# Patient Record
Sex: Male | Born: 1970 | Race: White | Hispanic: No | Marital: Married | Smoking: Never smoker
Health system: Southern US, Community
[De-identification: ages and names within clinical notes are randomized; demographics above are authoritative.]

## PROBLEM LIST (undated history)

## (undated) DIAGNOSIS — I10 Essential (primary) hypertension: Secondary | ICD-10-CM

## (undated) DIAGNOSIS — K219 Gastro-esophageal reflux disease without esophagitis: Secondary | ICD-10-CM

## (undated) DIAGNOSIS — L570 Actinic keratosis: Secondary | ICD-10-CM

## (undated) HISTORY — DX: Actinic keratosis: L57.0

---

## 2006-11-08 ENCOUNTER — Emergency Department: Payer: Self-pay | Admitting: Emergency Medicine

## 2007-05-28 ENCOUNTER — Ambulatory Visit: Payer: Self-pay | Admitting: Family Medicine

## 2008-07-13 IMAGING — CT CT ABD-PELV W/O CM
1 of 2 series · 15 of 32 positions shown, 19 images · non-contrast
Comparison: none

REASON FOR EXAM: (1) L flank pain; (2) L flank pain
COMMENTS:

[Series 2: stone · axial · 0.77mm/px · z∈[-1100,-674]mm · 15 of 160 slices shown, 19 images]
[im 12/160  soft-tissue]
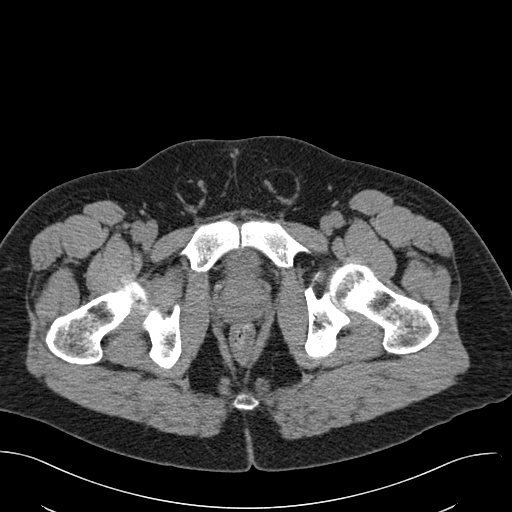
[im 12/160  bone]
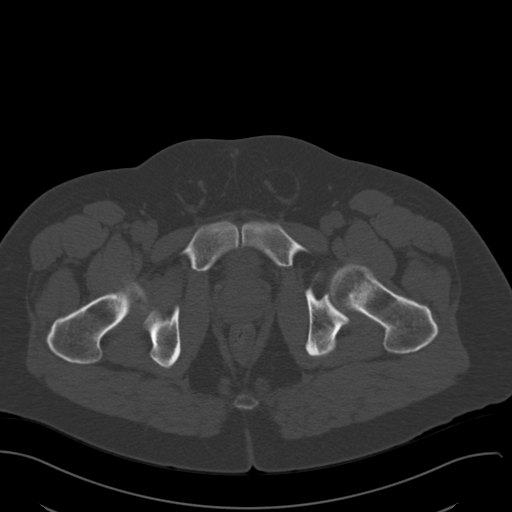
[im 23/160  soft-tissue]
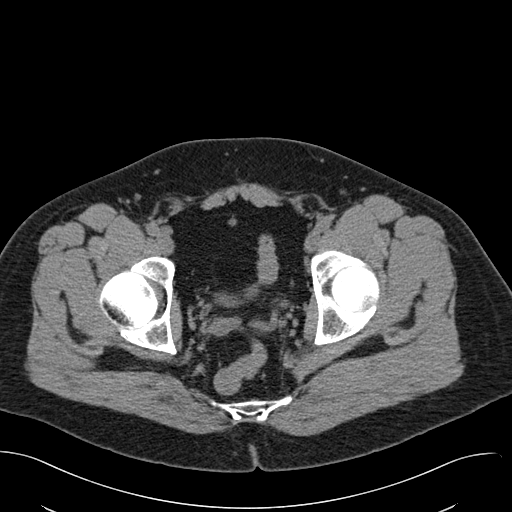
[im 35/160  soft-tissue]
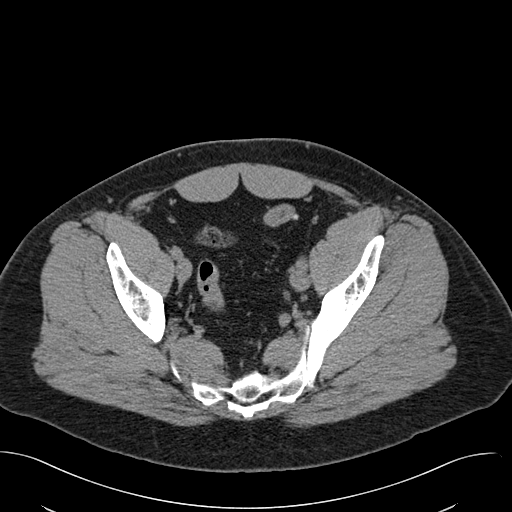
[im 46/160  soft-tissue]
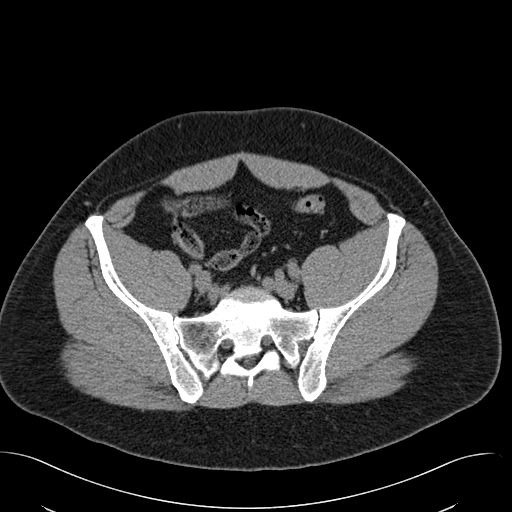
[im 57/160  soft-tissue]
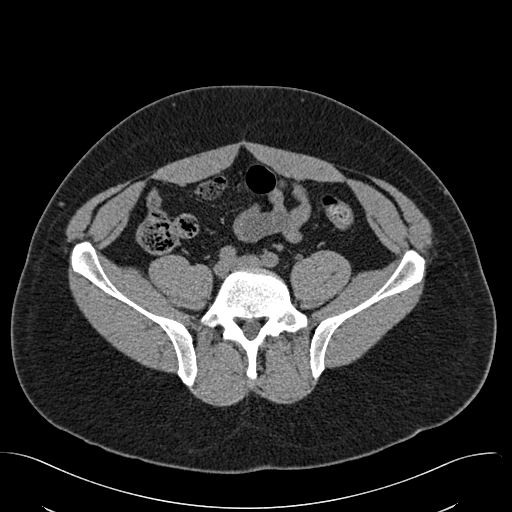
[im 69/160  soft-tissue]
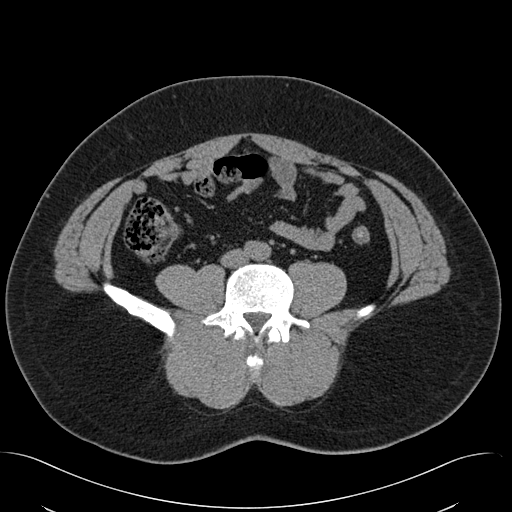
[im 80/160  soft-tissue]
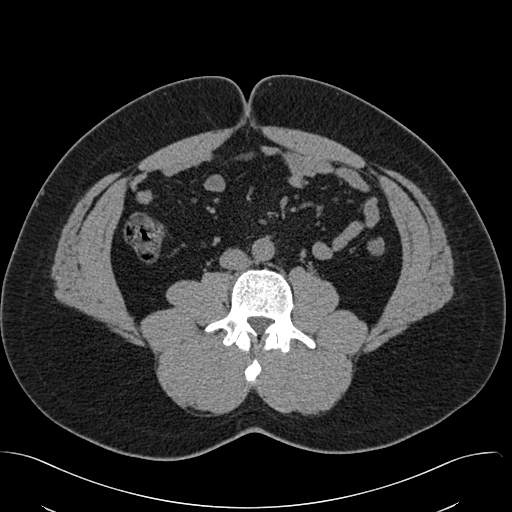
[im 91/160  soft-tissue]
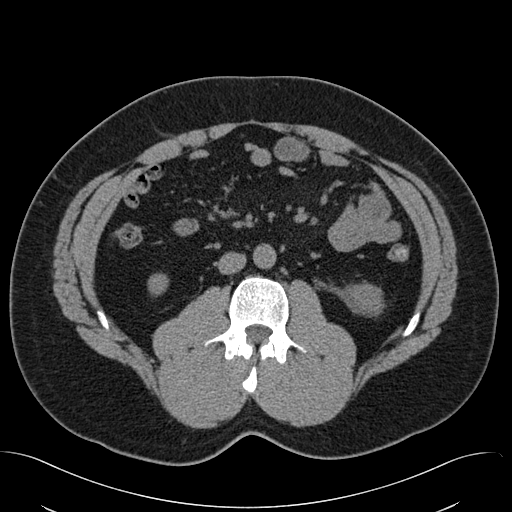
[im 103/160  soft-tissue]
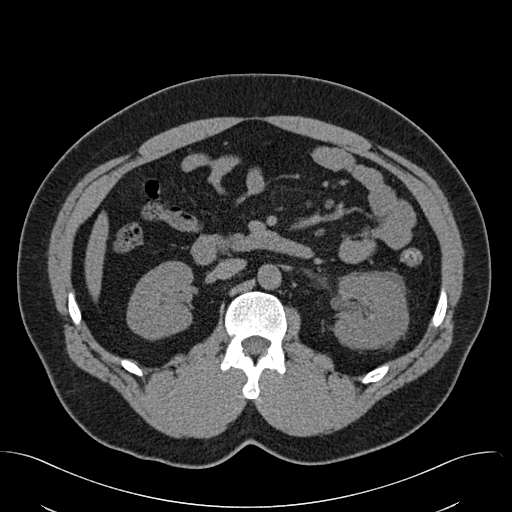
[im 103/160  bone]
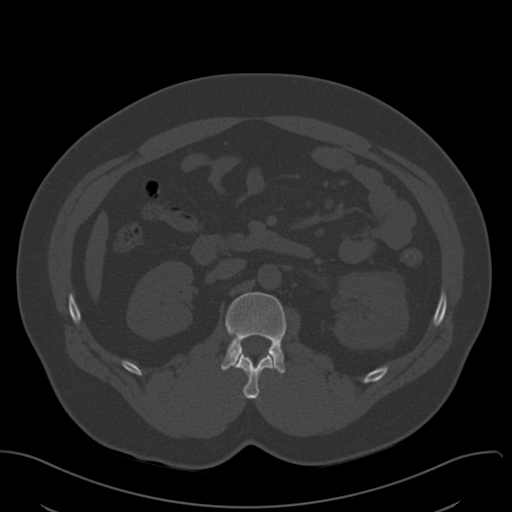
[im 114/160  soft-tissue]
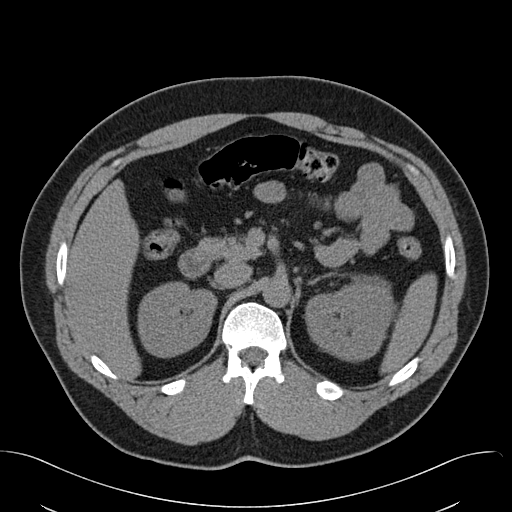
[im 125/160  soft-tissue]
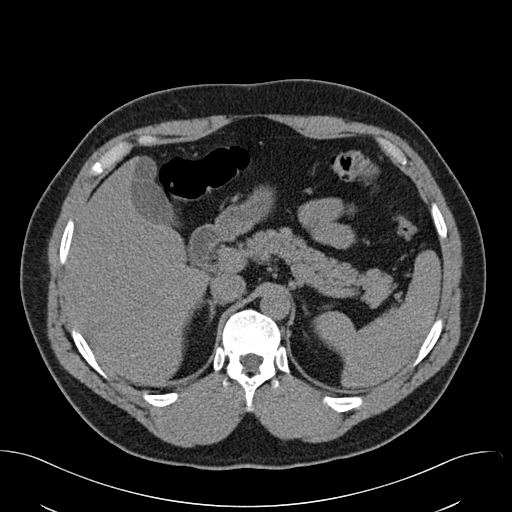
[im 137/160  soft-tissue]
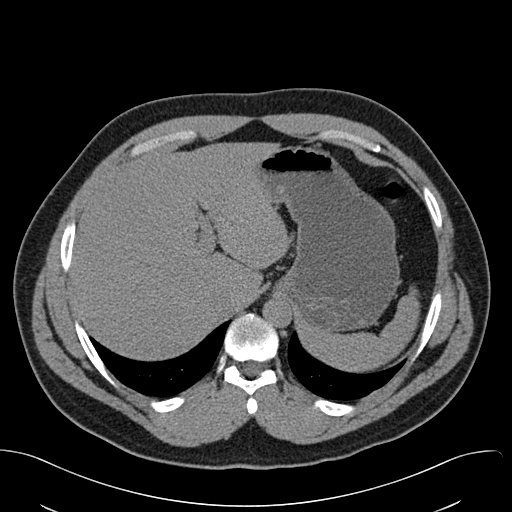
[im 137/160  lung]
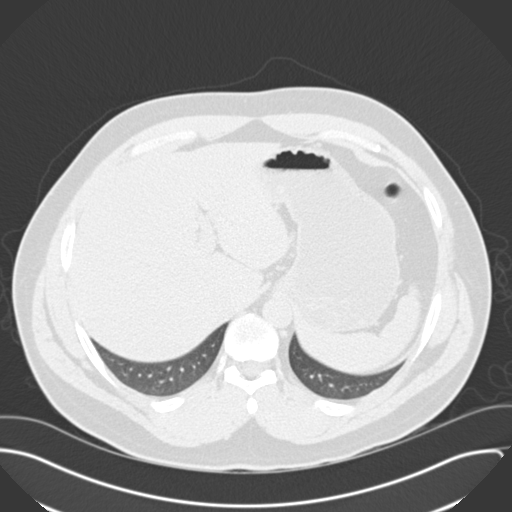
[im 142/160  lung]
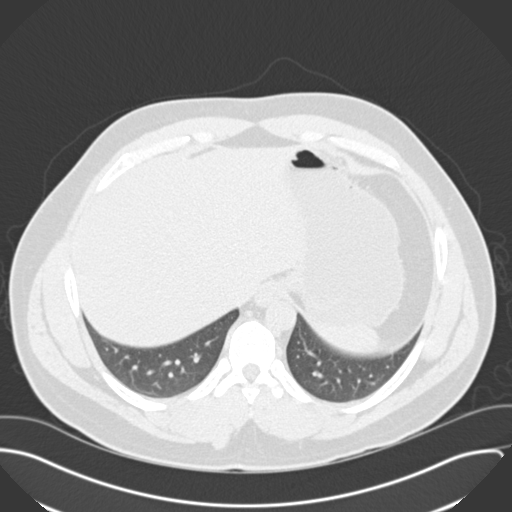
[im 148/160  soft-tissue]
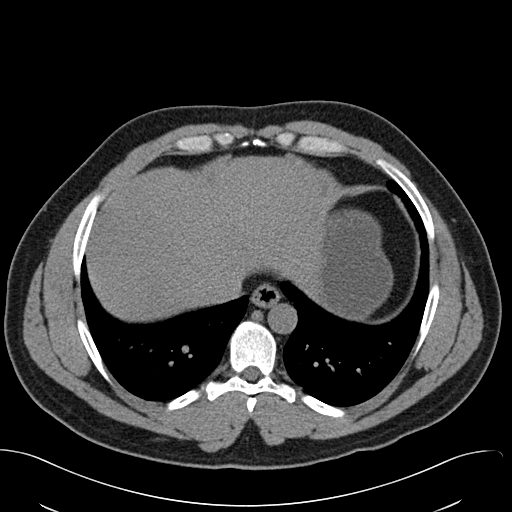
[im 148/160  lung]
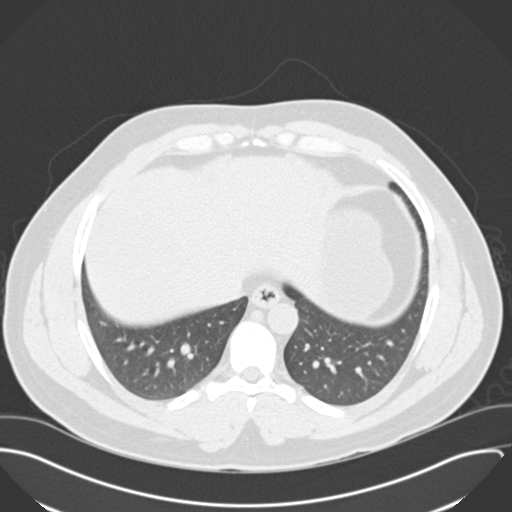
[im 154/160  lung]
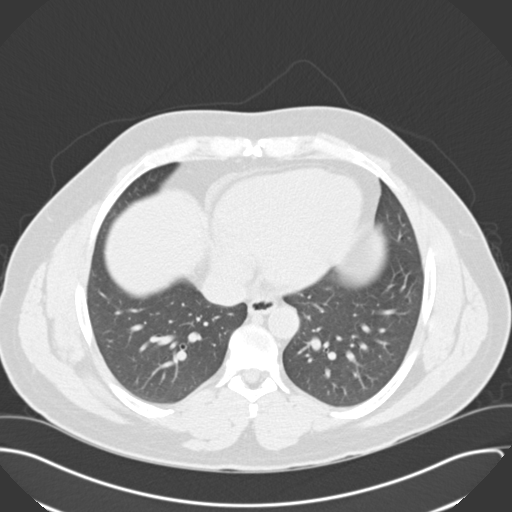

[15 of 32 positions shown; findings below may reference images not displayed]

PROCEDURE:     CT  - CT ABDOMEN AND PELVIS W[DATE]  [DATE]

RESULT:     Emergent CT of the abdomen and pelvis is performed. The patient
has no prior exam for comparison. The lung bases are clear. There is
perinephric stranding and a trace amount of perinephric fluid present on the
LEFT. The fluid thickness is no more than a 3 to 4 mm in thickness. There is
some stranding or fluid surrounding the proximal portion of the LEFT ureter.
The collecting system and ureter on the LEFT are slightly more prominent
than that on the RIGHT. The bladder is nondistended. No definite radiopaque
stones are seen. The possibility of a recently passed stone would be most
likely. A nonvisible obstructing mass could also be considered. Clinical and
laboratory correlation and followup is recommended. There is no free air.
The appendix appears to be normal and contains some air. There is no
evidence of diverticulitis. No bowel wall thickening is seen. The pancreas,
gallbladder, liver, spleen and adrenal glands as well as the RIGHT kidney
appear to be grossly normal for a noncontrast study. The aorta is normal in
caliber. There is no evidence of hemorrhage.
IMPRESSION: Slight prominence the LEFT ureter and LEFT renal collecting system without
significant hydronephrosis. There is a small amount of perinephric fluid
present on the LEFT. The appearance suggests a possible recently passed LEFT
ureteral stone. If the patient's symptoms have not resolved, then further
investigation and followup is suggested.

## 2009-08-12 ENCOUNTER — Ambulatory Visit: Payer: Self-pay | Admitting: Internal Medicine

## 2013-04-28 ENCOUNTER — Ambulatory Visit: Payer: Self-pay | Admitting: Internal Medicine

## 2013-05-25 ENCOUNTER — Ambulatory Visit: Payer: Self-pay | Admitting: Otolaryngology

## 2013-05-26 LAB — PATHOLOGY REPORT

## 2014-01-19 DIAGNOSIS — I10 Essential (primary) hypertension: Secondary | ICD-10-CM | POA: Insufficient documentation

## 2014-01-19 DIAGNOSIS — E890 Postprocedural hypothyroidism: Secondary | ICD-10-CM | POA: Insufficient documentation

## 2014-11-10 NOTE — Op Note (Signed)
PATIENT NAME:  Timothy Mccullough, Timothy Mccullough MR#:  [id number] DATE OF BIRTH:  1971/03/08  DATE OF PROCEDURE:  05/25/2013  PREOPERATIVE DIAGNOSIS: Left thyroid nodule.   POSTOPERATIVE DIAGNOSES: Left thyroid nodule.   PROCEDURE: Left hemithyroidectomy.   SURGEON: Malon Kindle, MD.   ASSISTANT: Margaretha Sheffield, MD   ANESTHESIA: General endotracheal.   INDICATIONS: This is a 44 year old male with a large left thyroid nodule with FNA suggesting it could be a colloid nodule. The nodule was quite large; however, 7 cm in size thus far.   FINDINGS: This was a 7 cm nodule, was well defined capsule emanating from the lower pole of the left thyroid gland.   COMPLICATIONS: None.   DESCRIPTION OF PROCEDURE: After obtaining informed consent, the patient was taken to the Operating Room and placed in the supine position. After induction of general endotracheal anesthesia with use of the laryngeal nerve monitor, the neck was injected in the lower neck with 1% lidocaine with epinephrine 1:200,000. The laryngeal nerve monitor was directly visualized during placement to make sure the probes were between the vocal cords. The patient was then prepped and draped in the usual sterile fashion after being placed on a shoulder roll.   A 15 blade was used to incise the skin. The skin incision was carried down through the platysma with the Harmonic scalpel. The strap muscles were identified and divided in the midline and retracted laterally. Large left thyroid lobe mass was immediately identified. The strap muscles were dissected off of the mass and it was carefully dissected out with both blunt dissection, as well as dividing soft tissue attachments with the Harmonic scalpel.  Feeding vessels were identified and divided with the Harmonic scalpel as they were encountered. The nodule itself was well defined and very well encapsulated and we ended up removing the nodule separate from the small remaining superior portion of the thyroid lobe.  Late in the dissection the recurrent laryngeal nerve was identified and was well free from the nodule, which was removed separately. The remainder of the gland was then carefully dissected out identifying the recurrent laryngeal nerve and dividing the superior and inferior pole vessels with the Harmonic scalpel. Two separate structures consistent with inferior and superior parathyroid tissue were identified and preserved within associated fatty tissue in the neck adjacent to the thyroid gland itself. The thyroid gland was dissected off of the trachea at [facility] ligament with the recurrent laryngeal nerve in full view utilizing the Harmonic scalpel. It was then sent along with the nodule as one specimen.   The wound is irrigated and inspected for bleeding. Oozing was controlled with the bipolar cautery and there was a bleeding area inferiorly just below the clavicles that appeared to be small vein. This was clamped and suture ligated. The wound was then closed in layers. The strap muscles were reapproximated with 3-0 Vicryl suture. The platysma muscle was reapproximated with 3-0 Vicryl followed by subcutaneous closure. The skin was closed using a 3-0 Prolene suture in a running subcutaneous stitch.   Prior to closure a #7 JP drain was placed through a separate stab incision in the skin. This was secured to the skin with 3-0 Prolene suture. The patient was then returned to the anesthesiologist for awakening. He was awakened and taken to the recovery room in good condition postoperatively. It should be noted that after identification of the recurrent laryngeal nerve with the nerve stimulator, the nerve was again stimulated after removal of all the thyroid tissue to ensure its integrity and  it did stimulate. Blood loss was less than 200 mL.   _________________ Sammuel Hines. Richardson Landry, MD psb:sg D: 05/25/2013 10:34:01 ET T: 05/25/2013 10:55:14 ET JOB#: [id number]  cc: Sammuel Hines. Richardson Landry, MD, <Dictator> Riley Nearing  MD ELECTRONICALLY SIGNED 05/31/2013 8:35

## 2015-01-01 IMAGING — US THYROID ULTRASOUND
1 series · 14 of 25 positions shown · non-contrast
Comparison: none

REASON FOR EXAM: thyroidmegaly
COMMENTS:

[Series 1: thyroid ultrasound · 0.10mm/px · 14 of 64 slices shown]
[im 1/64]
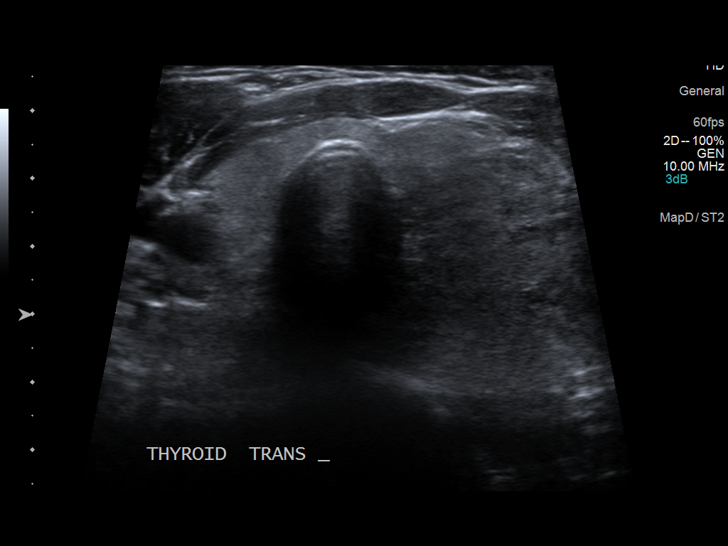
[im 6/64]
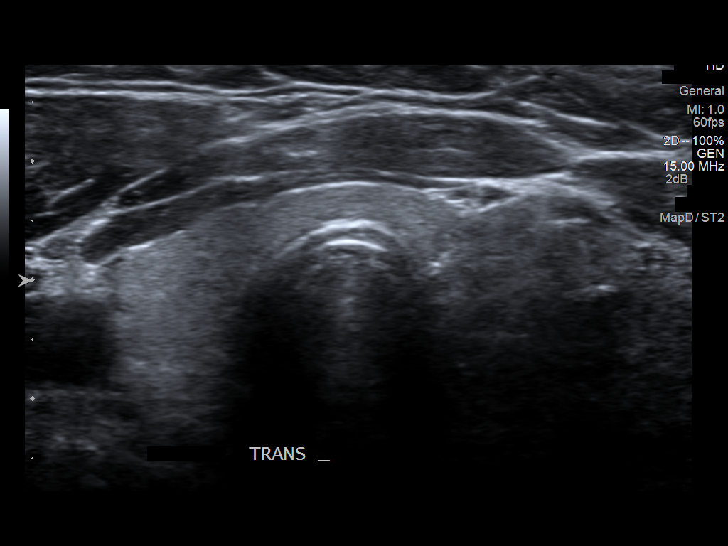
[im 11/64]
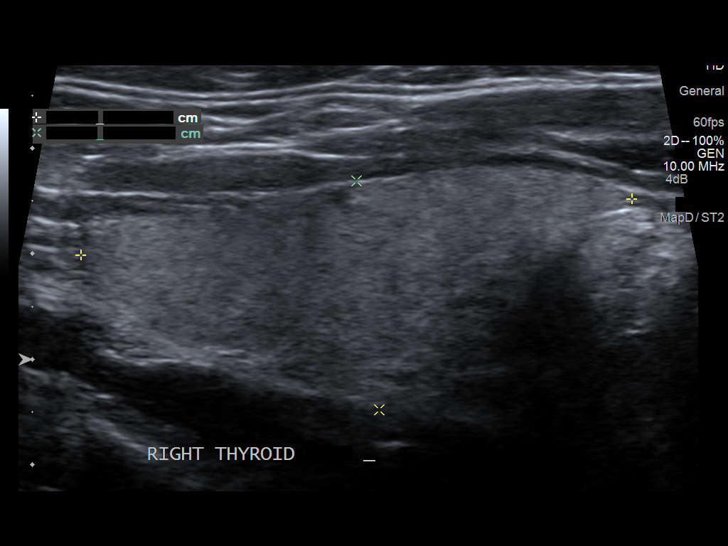
[im 16/64]
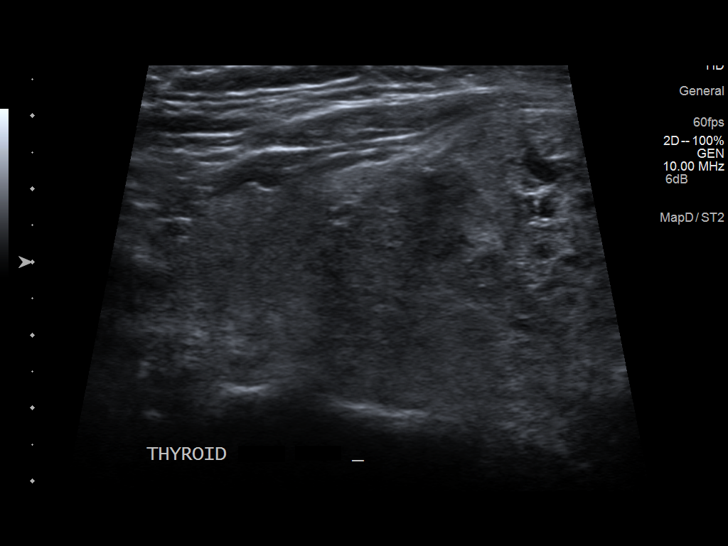
[im 22/64]
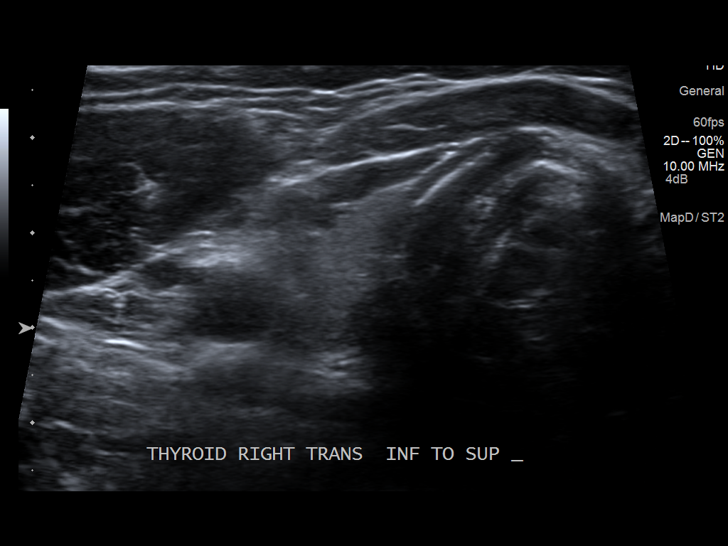
[im 24/64]
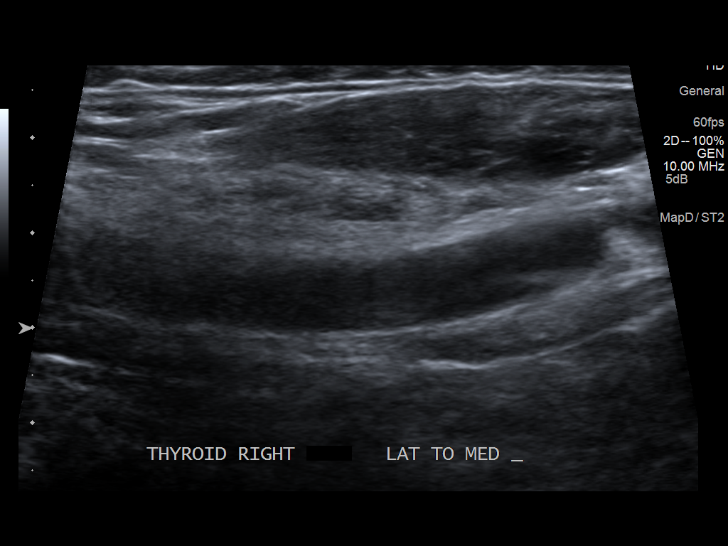
[im 29/64]
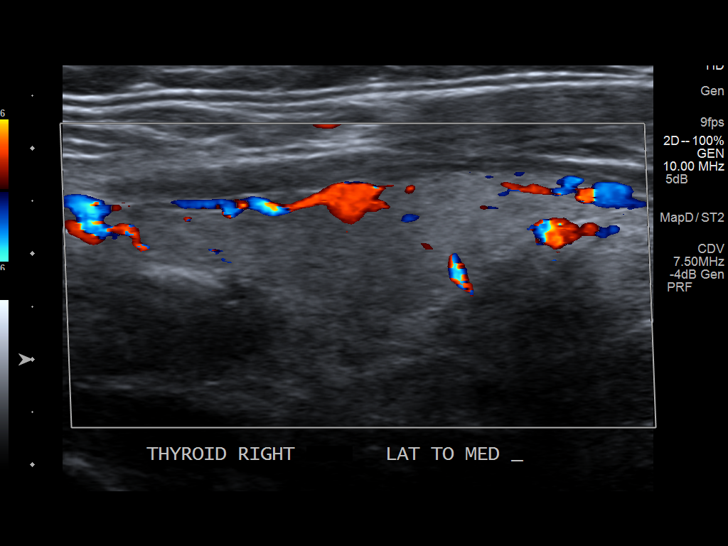
[im 35/64]
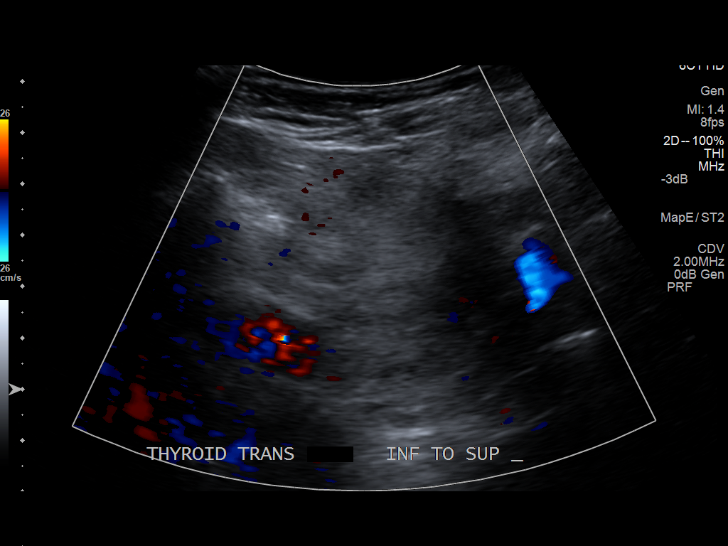
[im 40/64]
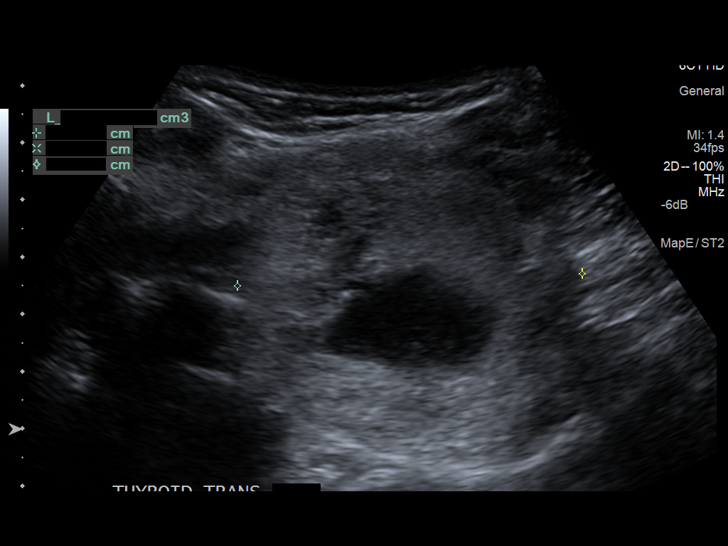
[im 43/64]
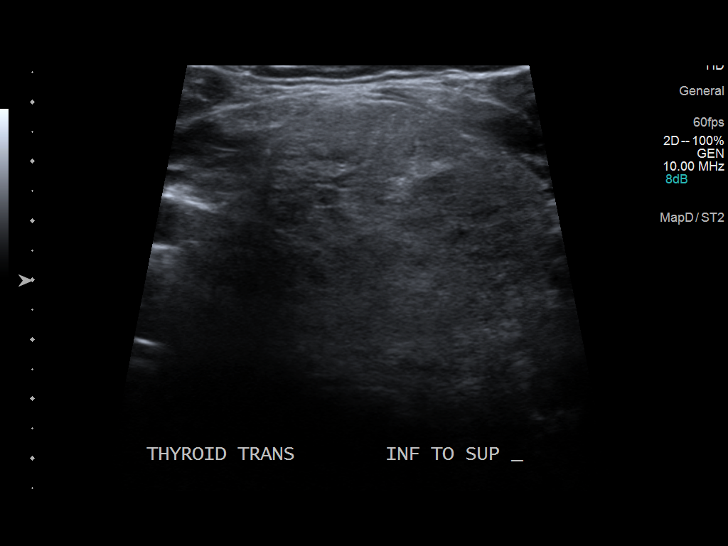
[im 48/64]
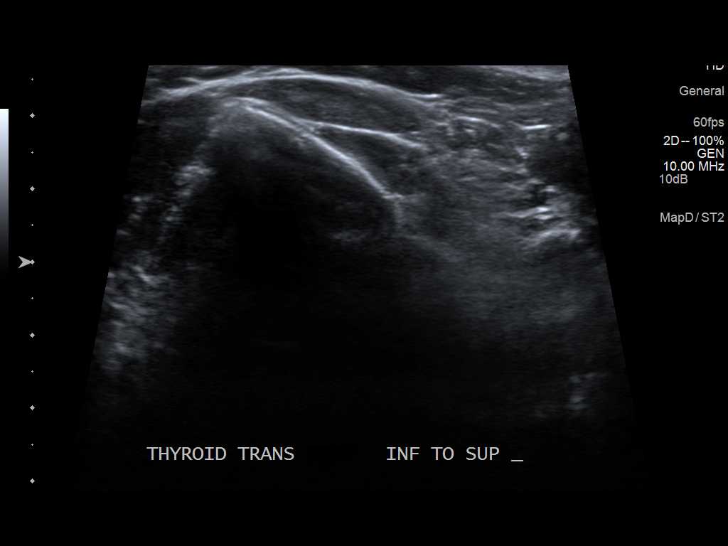
[im 53/64]
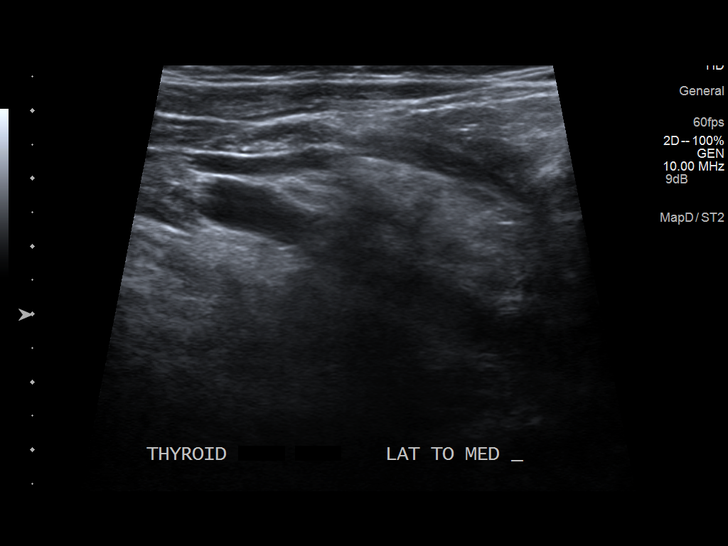
[im 58/64]
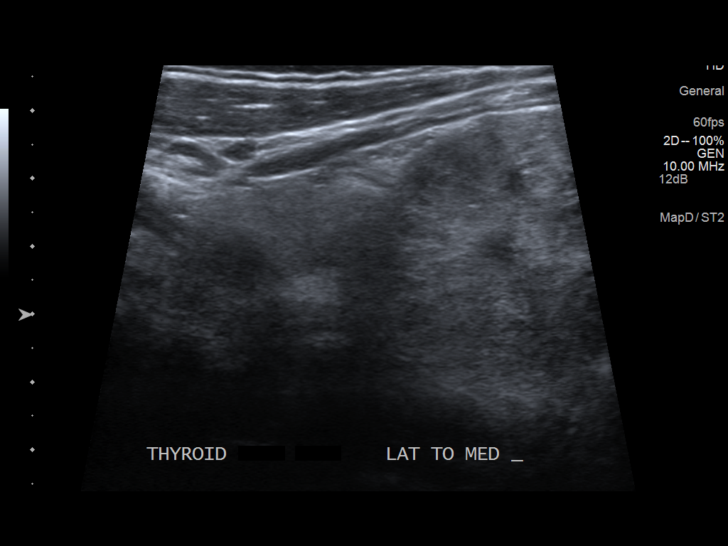
[im 64/64]
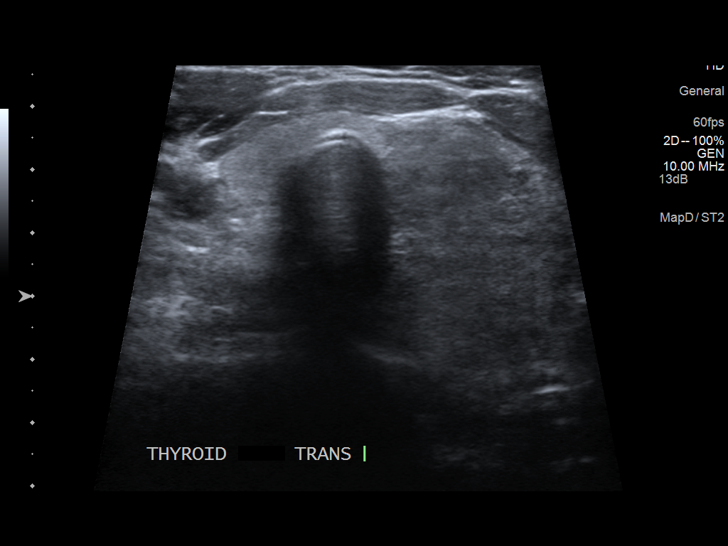

[14 of 25 positions shown; findings below may reference images not displayed]

PROCEDURE:     US  - US SOFT TISSUE HEAD/NECK/THYROID  - April 28, 2013  [DATE]

RESULT:     Ultrasound demonstrates the presence of a large complex mass
with solid and cystic components involving the left thyroid lobe lower two
thirds measuring 7.83 x 4.73 x 6.03 cm. There may be some punctate
nonshadowing areas of calcification. The overall size of the left lobe is
10.83 x 2.07 x 4.97 cm. The right thyroid lobe measures 5.25 x 1.44 x
cm and is relatively homogeneous. No mass is seen in the right thyroid lobe
or isthmus. The isthmus shows an anterior to posterior dimension of 3. to
millimeters.
IMPRESSION: 1. Thyromegaly with a large mass with a cystic component and possible
calcifications involving the lower two thirds of the left lobe. Exclusion of
malignant disease in the left thyroid lobe is not possible. Certainly benign
conditions can produce a similar appearance.

[REDACTED]

## 2017-07-29 ENCOUNTER — Ambulatory Visit (INDEPENDENT_AMBULATORY_CARE_PROVIDER_SITE_OTHER): Payer: BC Managed Care – PPO

## 2017-07-29 ENCOUNTER — Ambulatory Visit
Admission: EM | Admit: 2017-07-29 | Discharge: 2017-07-29 | Disposition: A | Discharge status: Home / Self Care | Payer: BC Managed Care – PPO | Attending: Family Medicine | Admitting: Family Medicine

## 2017-07-29 ENCOUNTER — Other Ambulatory Visit: Payer: Self-pay

## 2017-07-29 DIAGNOSIS — S0083XA Contusion of other part of head, initial encounter: Secondary | ICD-10-CM | POA: Diagnosis not present

## 2017-07-29 DIAGNOSIS — Z23 Encounter for immunization: Secondary | ICD-10-CM

## 2017-07-29 DIAGNOSIS — S01111A Laceration without foreign body of right eyelid and periocular area, initial encounter: Secondary | ICD-10-CM | POA: Diagnosis not present

## 2017-07-29 DIAGNOSIS — H5711 Ocular pain, right eye: Secondary | ICD-10-CM

## 2017-07-29 DIAGNOSIS — W01198A Fall on same level from slipping, tripping and stumbling with subsequent striking against other object, initial encounter: Secondary | ICD-10-CM | POA: Diagnosis not present

## 2017-07-29 MED ORDER — TETANUS-DIPHTH-ACELL PERTUSSIS 5-2.5-18.5 LF-MCG/0.5 IM SUSP
0.5000 mL | Freq: Once | INTRAMUSCULAR | Status: AC
  Administered 2017-07-29: 0.5 mL via INTRAMUSCULAR

## 2017-07-29 MED ORDER — LIDOCAINE-EPINEPHRINE (PF) 1 %-1:200000 IJ SOLN
10.0000 mL | Freq: Once | INTRAMUSCULAR | Status: AC
  Administered 2017-07-29: 10 mL

## 2017-07-29 MED ORDER — MUPIROCIN 2 % EX OINT
TOPICAL_OINTMENT | CUTANEOUS | 0 refills | Status: DC
Start: 2017-07-29 — End: 2021-03-04

## 2017-07-29 NOTE — ED Provider Notes (Addendum)
[facility] ____________________________________________  Time seen: Approximately 7:15 PM  I have reviewed the triage vital signs and the nursing notes.   HISTORY  Chief Complaint Head Laceration (right eye)   HPI Timothy Mccullough is a 47 y.o. male presenting with wife at bedside for evaluation of right eyebrow laceration that occurred approximately 345 this afternoon.  Patient reports that he was working on a roof truss, and reports that he missed step causing himself to trip forward and fall.  Did not fall directly to the ground but only on the truss.  States in the process of trying to catch himself he had had his hammer in his hand and his hammer hit his right eyebrow area causing laceration.  Denies loss of conscious.  States that he does have a mild headache directly at laceration site, stating that again the headache is very mild.  Reports that he continued to work after the injury to ensure job completion.  Denies any vision changes, nausea, vomiting, syncope, near syncope, unsteady gait, difficulty focusing, unilateral weakness or other complaints.  States otherwise feels well.  No over-the-counter medications taken for same complaint.  Has not cleaned the area.  States that he came here directly from finishing his job.  Unsure of last tetanus immunization.  Reports otherwise feels well.  Denies chest pain, shortness of breath, abdominal pain, back pain, extremity pain, extremity swelling or rash. Denies recent sickness. Denies recent antibiotic use.    History reviewed. No pertinent past medical history.  There are no active problems to display for this patient.   History reviewed. No pertinent surgical history.   No current facility-administered medications for this encounter.   Current Outpatient Medications:  .  mupirocin ointment (BACTROBAN) 2 %, Apply two times a day for 7 days., Disp: 22 g, Rfl: 0  Allergies Patient has no known allergies.  Family History    Problem Relation Age of Onset  . Diabetes Mother   . Cancer Father     Social History Social History   Tobacco Use  . Smoking status: Never Smoker  . Smokeless tobacco: Never Used  Substance Use Topics  . Alcohol use: No    Frequency: Never  . Drug use: No    Review of Systems Constitutional: No fever/chills Eyes: No visual changes. Cardiovascular: Denies chest pain. Respiratory: Denies shortness of breath. Gastrointestinal: No abdominal pain.  No nausea, no vomiting.  Musculoskeletal: Negative for back pain. Skin: Negative for rash. Neurological: Negative for focal weakness or numbness.  ____________________________________________   PHYSICAL EXAM:  VITAL SIGNS: ED Triage Vitals  Enc Vitals Group     BP 07/29/17 1840 (!) 143/89     Pulse Rate 07/29/17 1840 90     Resp 07/29/17 1840 18     Temp 07/29/17 1840 97.8 F (36.6 C)     Temp Source 07/29/17 1840 Oral     SpO2 07/29/17 1840 99 %     Weight 07/29/17 1841 244 lb (110.7 kg)     Height 07/29/17 1841 5\' 8"  (1.727 m)     Head Circumference --      Peak Flow --      Pain Score 07/29/17 1841 0     Pain Loc --      Pain Edu? --      Excl. in [place]? --     Constitutional: Alert and oriented. Well appearing and in no acute distress. Eyes: Conjunctivae are normal. PERRL. EOMI. No pain with EOMs.  ENT  Head: Normocephalic. See skin below.       Ears: nontender, no erythema, normal TMs.       Nose: No congestion/rhinnorhea.      Mouth/Throat: Mucous membranes are moist.Oropharynx non-erythematous. No dental injury noted.  Neck: No stridor. Supple without meningismus.  Hematological/Lymphatic/Immunilogical: No cervical lymphadenopathy. Cardiovascular: Normal rate, regular rhythm. Grossly normal heart sounds.  Good peripheral circulation. Respiratory: Normal respiratory effort without tachypnea nor retractions. Breath sounds are clear and equal bilaterally. No wheezes, rales, rhonchi. Musculoskeletal:   No  midline cervical, thoracic or lumbar tenderness to palpation. Neurologic:  Normal speech and language. No gross focal neurologic deficits are appreciated. Speech is normal. No gait instability. Negative Romberg.  5/5 strength to bilateral upper and lower extremities.  Skin:  Skin is warm, dry. Except: Right lateral eyebrow 3 cm laceration present with jagged edges and superficial surrounding abrasion, mild active bleeding, mild tenderness to direct palpation with mild direct superior right orbit tenderness to palpation.  No foreign body visualized, no bone visualized. Psychiatric: Mood and affect are normal. Speech and behavior are normal. Patient exhibits appropriate insight and judgment   ___________________________________________   LABS (all labs ordered are listed, but only abnormal results are displayed)  Labs Reviewed - No data to display  RADIOLOGY  Dg Orbits  Result Date: 07/29/2017 CLINICAL DATA:  RIGHT superolateral orbital pain and laceration EXAM: ORBITS - COMPLETE 4+ VIEW COMPARISON:  None FINDINGS: Mild nasal septal deviation to the LEFT. Visualized paranasal sinuses clear. Bony orbits intact. No orbital or visualized facial bone fractures identified. Mastoid air cells appear clear. IMPRESSION: No acute osseous abnormalities. Mild nasal septal deviation to the LEFT Electronically Signed   By: Lavonia Dana M.D.   On: 07/29/2017 19:44   ____________________________________________   PROCEDURES Procedures  Procedure(s) performed:  Procedure explained and verbal consent obtained. Consent: Verbal consent obtained. Written consent not obtained. Risks and benefits: risks, benefits and alternatives were discussed Patient identity confirmed: verbally with patient and hospital-assigned identification number  Consent given by: patient   Laceration Repair Location: right eyebrow Length: 3cm Foreign bodies: no foreign bodies Tendon involvement: none Nerve involvement:  none Preparation: Patient was prepped and draped in the usual sterile fashion. Anesthesia with 1% Lidocaine with epi 5 mls Irrigation solution: saline and betadine Wound margins revised Irrigation method: jet lavage Amount of cleaning: copious Repaired with 5-0 prolene  Number of sutures: 4 Technique: simple interrupted  Approximation: loose Patient tolerate well. Wound well approximated post repair.  Antibiotic ointment and dressing applied.  Wound care instructions provided.  Observe for any signs of infection or other problems.      INITIAL IMPRESSION / ASSESSMENT AND PLAN / ED COURSE  Pertinent labs & imaging results that were available during my care of the patient were reviewed by me and considered in my medical decision making (see chart for details).  Well-appearing patient.  No acute distress.  Tripped and mechanical injury earlier today with right eyebrow facial laceration and contusion.  Right orbit x-ray as per above radiologist, reviewed by myself, negative acute bony abnormality.  Laceration cleaned copiously and repaired as above.  Will treat with topical Bactroban at home.  Encouraged ice compresses.  Wound care.  Return in 7 days for suture removal.  Discussed sooner return parameters.Discussed indication, risks and benefits of medications with patient.  Tetanus immunization updated.  Discussed follow up with Primary care physician this week. Discussed follow up and return parameters including no resolution or any worsening concerns. Patient verbalized  understanding and agreed to plan.   ____________________________________________   FINAL CLINICAL IMPRESSION(S) / ED DIAGNOSES  Final diagnoses:  Laceration of right eyebrow, initial encounter  Contusion of face, initial encounter     ED Discharge Orders        Ordered    mupirocin ointment (BACTROBAN) 2 %     07/29/17 2024       Note: This dictation was prepared with Dragon dictation along with smaller  phrase technology. Any transcriptional errors that result from this process are unintentional.           Marylene Land, NP 07/29/17 2038

## 2017-07-29 NOTE — ED Triage Notes (Signed)
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

## 2017-07-29 NOTE — Discharge Instructions (Signed)
Use medication as prescribed. Keep clean. Ice.   Return to urgent care or primary care in 7 days for suture removal.   Follow up with your primary care physician this week as needed. Return to Urgent care for new or worsening concerns.

## 2018-09-23 DIAGNOSIS — D229 Melanocytic nevi, unspecified: Secondary | ICD-10-CM

## 2018-09-23 HISTORY — DX: Melanocytic nevi, unspecified: D22.9

## 2020-01-18 ENCOUNTER — Ambulatory Visit: Payer: BC Managed Care – PPO | Admitting: Dermatology

## 2020-03-29 ENCOUNTER — Ambulatory Visit: Payer: BC Managed Care – PPO | Admitting: Dermatology

## 2020-03-29 ENCOUNTER — Other Ambulatory Visit: Payer: Self-pay

## 2020-03-29 DIAGNOSIS — L720 Epidermal cyst: Secondary | ICD-10-CM

## 2020-03-29 DIAGNOSIS — D485 Neoplasm of uncertain behavior of skin: Secondary | ICD-10-CM

## 2020-03-29 DIAGNOSIS — L814 Other melanin hyperpigmentation: Secondary | ICD-10-CM

## 2020-03-29 DIAGNOSIS — Z86018 Personal history of other benign neoplasm: Secondary | ICD-10-CM

## 2020-03-29 DIAGNOSIS — D229 Melanocytic nevi, unspecified: Secondary | ICD-10-CM | POA: Diagnosis not present

## 2020-03-29 DIAGNOSIS — L570 Actinic keratosis: Secondary | ICD-10-CM

## 2020-03-29 DIAGNOSIS — D1800 Hemangioma unspecified site: Secondary | ICD-10-CM | POA: Diagnosis not present

## 2020-03-29 DIAGNOSIS — C44311 Basal cell carcinoma of skin of nose: Secondary | ICD-10-CM | POA: Diagnosis not present

## 2020-03-29 DIAGNOSIS — L821 Other seborrheic keratosis: Secondary | ICD-10-CM

## 2020-03-29 DIAGNOSIS — C4491 Basal cell carcinoma of skin, unspecified: Secondary | ICD-10-CM

## 2020-03-29 DIAGNOSIS — Z1283 Encounter for screening for malignant neoplasm of skin: Secondary | ICD-10-CM | POA: Diagnosis not present

## 2020-03-29 DIAGNOSIS — L578 Other skin changes due to chronic exposure to nonionizing radiation: Secondary | ICD-10-CM

## 2020-03-29 HISTORY — DX: Basal cell carcinoma of skin, unspecified: C44.91

## 2020-03-29 NOTE — Progress Notes (Signed)
Follow-Up Visit   Subjective  Timothy Mccullough is a 49 y.o. male who presents for the following: Annual Exam (History of dysplastic nevi - TBSE today). The patient presents for Total-Body Skin Exam (TBSE) for skin cancer screening and mole check.  The following portions of the chart were reviewed this encounter and updated as appropriate:  Tobacco  Allergies  Meds  Problems  Med Hx  Surg Hx  Fam Hx     Review of Systems:  No other skin or systemic complaints except as noted in HPI or Assessment and Plan.  Objective  Well appearing patient in no apparent distress; mood and affect are within normal limits.  A full examination was performed including scalp, head, eyes, ears, nose, lips, neck, chest, axillae, abdomen, back, buttocks, bilateral upper extremities, bilateral lower extremities, hands, feet, fingers, toes, fingernails, and toenails. All findings within normal limits unless otherwise noted below.  Objective  Right Alar Crease: 0.6 cm pearly papule  Objective  Left temple: Subcutaneous nodule.   Objective  Right Ear: Erythematous thin papules/macules with gritty scale.    Assessment & Plan    History of Dysplastic Nevi - No evidence of recurrence today - Recommend regular full body skin exams - Recommend daily broad spectrum sunscreen SPF 30+ to sun-exposed areas, reapply every 2 hours as needed.  - Call if any new or changing lesions are noted between office visits  Lentigines - Scattered tan macules - Discussed due to sun exposure - Benign, observe - Call for any changes  Seborrheic Keratoses - Stuck-on, waxy, tan-brown papules and plaques  - Discussed benign etiology and prognosis. - Observe - Call for any changes  Melanocytic Nevi - Tan-brown and/or pink-flesh-colored symmetric macules and papules - Benign appearing on exam today - Observation - Call clinic for new or changing moles - Recommend daily use of broad spectrum spf 30+ sunscreen to  sun-exposed areas.   Hemangiomas - Red papules - Discussed benign nature - Observe - Call for any changes  Actinic Damage - diffuse scaly erythematous macules with underlying dyspigmentation - Recommend daily broad spectrum sunscreen SPF 30+ to sun-exposed areas, reapply every 2 hours as needed.  - Call for new or changing lesions.  Skin cancer screening performed today.   Neoplasm of uncertain behavior of skin Right nose Alar Crease  Epidermal / dermal shaving  Lesion diameter (cm):  0.6 Informed consent: discussed and consent obtained   Timeout: patient name, date of birth, surgical site, and procedure verified   Procedure prep:  Patient was prepped and draped in usual sterile fashion Prep type:  Isopropyl alcohol Anesthesia: the lesion was anesthetized in a standard fashion   Anesthetic:  1% lidocaine w/ epinephrine 1-100,000 buffered w/ 8.4% NaHCO3 Instrument used: flexible razor blade   Hemostasis achieved with: pressure, aluminum chloride and electrodesiccation   Outcome: patient tolerated procedure well   Post-procedure details: sterile dressing applied and wound care instructions given   Dressing type: bandage and petrolatum    Destruction of lesion Complexity: extensive   Destruction method: electrodesiccation and curettage   Informed consent: discussed and consent obtained   Timeout:  patient name, date of birth, surgical site, and procedure verified Procedure prep:  Patient was prepped and draped in usual sterile fashion Prep type:  Isopropyl alcohol Anesthesia: the lesion was anesthetized in a standard fashion   Anesthetic:  1% lidocaine w/ epinephrine 1-100,000 buffered w/ 8.4% NaHCO3 Curettage performed in three different directions: Yes   Electrodesiccation performed over the curetted  area: Yes   Final wound size (cm):  0.8 Hemostasis achieved with:  pressure and aluminum chloride Outcome: patient tolerated procedure well with no complications     Post-procedure details: sterile dressing applied and wound care instructions given   Dressing type: bandage and petrolatum    Specimen 1 - Surgical pathology Differential Diagnosis: BCC vs other Check Margins: No 0.6 cm pearly papule  Epidermal inclusion cyst Left temple Benign.  AK (actinic keratosis) Right Ear  Destruction of lesion - Right Ear Complexity: simple   Destruction method: cryotherapy   Informed consent: discussed and consent obtained   Timeout:  patient name, date of birth, surgical site, and procedure verified Lesion destroyed using liquid nitrogen: Yes   Region frozen until ice ball extended beyond lesion: Yes   Outcome: patient tolerated procedure well with no complications   Post-procedure details: wound care instructions given    Return in about 6 months (around 09/26/2020) for UBSE.  I, Ashok Cordia, CMA, am acting as scribe for Sarina Ser, MD .  Documentation: I have reviewed the above documentation for accuracy and completeness, and I agree with the above.  Sarina Ser, MD

## 2020-03-29 NOTE — Patient Instructions (Signed)

## 2020-03-30 ENCOUNTER — Encounter: Payer: Self-pay | Admitting: Dermatology

## 2020-04-05 ENCOUNTER — Telehealth: Payer: Self-pay

## 2020-04-05 NOTE — Telephone Encounter (Signed)
-----   Message from Ralene Bathe, MD sent at 04/05/2020  1:53 PM EDT ----- Skin , right alar crease BASAL CELL CARCINOMA, NODULAR PATTERN  Cancer - BCC Already treated Recheck next visit

## 2020-04-05 NOTE — Telephone Encounter (Signed)
Advised patient of results/hd  

## 2020-09-27 ENCOUNTER — Encounter: Payer: Self-pay | Admitting: Dermatology

## 2020-09-27 ENCOUNTER — Ambulatory Visit: Payer: BC Managed Care – PPO | Admitting: Dermatology

## 2020-09-27 ENCOUNTER — Other Ambulatory Visit: Payer: Self-pay

## 2020-09-27 DIAGNOSIS — D229 Melanocytic nevi, unspecified: Secondary | ICD-10-CM

## 2020-09-27 DIAGNOSIS — L820 Inflamed seborrheic keratosis: Secondary | ICD-10-CM | POA: Diagnosis not present

## 2020-09-27 DIAGNOSIS — Z86018 Personal history of other benign neoplasm: Secondary | ICD-10-CM

## 2020-09-27 DIAGNOSIS — L720 Epidermal cyst: Secondary | ICD-10-CM

## 2020-09-27 DIAGNOSIS — Z85828 Personal history of other malignant neoplasm of skin: Secondary | ICD-10-CM

## 2020-09-27 DIAGNOSIS — L814 Other melanin hyperpigmentation: Secondary | ICD-10-CM

## 2020-09-27 DIAGNOSIS — L821 Other seborrheic keratosis: Secondary | ICD-10-CM

## 2020-09-27 DIAGNOSIS — L578 Other skin changes due to chronic exposure to nonionizing radiation: Secondary | ICD-10-CM

## 2020-09-27 DIAGNOSIS — D1800 Hemangioma unspecified site: Secondary | ICD-10-CM

## 2020-09-27 DIAGNOSIS — Z1283 Encounter for screening for malignant neoplasm of skin: Secondary | ICD-10-CM

## 2020-09-27 DIAGNOSIS — Z808 Family history of malignant neoplasm of other organs or systems: Secondary | ICD-10-CM

## 2020-09-27 DIAGNOSIS — D0359 Melanoma in situ of other part of trunk: Secondary | ICD-10-CM | POA: Diagnosis not present

## 2020-09-27 DIAGNOSIS — D492 Neoplasm of unspecified behavior of bone, soft tissue, and skin: Secondary | ICD-10-CM

## 2020-09-27 DIAGNOSIS — D039 Melanoma in situ, unspecified: Secondary | ICD-10-CM

## 2020-09-27 HISTORY — DX: Melanoma in situ, unspecified: D03.9

## 2020-09-27 NOTE — Patient Instructions (Addendum)
If you have any questions or concerns for your doctor, please call our main line at [phone] and press option 4 to reach your doctor's medical assistant. If no one answers, please leave a voicemail as directed and we will return your call as soon as possible. Messages left after 4 pm will be answered the following business day.   You may also send [place] a message via [place]. We typically respond to MyChart messages within 1-2 business days.  For prescription refills, please ask your pharmacy to contact our office. Our fax number is [phone].  If you have an urgent issue when the clinic is closed that cannot wait until the next business day, you can page your doctor at the number below.    Please note that while we do our best to be available for urgent issues outside of office hours, we are not available 24/7.   If you have an urgent issue and are unable to reach [place], you may choose to seek medical care at your doctor's office, retail clinic, urgent care center, or emergency room.  If you have a medical emergency, please immediately call 911 or go to the emergency department.  Pager Numbers  - Dr. Nehemiah Massed: [phone]  - Dr. Laurence Ferrari: [phone]  - Dr. Nicole Kindred: [phone]  In the event of inclement weather, please call our main line at [phone] for an update on the status of any delays or closures.  Dermatology Medication Tips: Please keep the boxes that topical medications come in in order to help keep track of the instructions about where and how to use these. Pharmacies typically print the medication instructions only on the boxes and not directly on the medication tubes.   If your medication is too expensive, please contact our office at [phone] option 4 or send [place] a message through [place].   We are unable to tell what your co-pay for medications will be in advance as this is different depending on your insurance coverage. However, we may be able to find a substitute  medication at lower cost or fill out paperwork to get insurance to cover a needed medication.   If a prior authorization is required to get your medication covered by your insurance company, please allow [place] 1-2 business days to complete this process.  Drug prices often vary depending on where the prescription is filled and some pharmacies may offer cheaper prices.  The website www.goodrx.com contains coupons for medications through different pharmacies. The prices here do not account for what the cost may be with help from insurance (it may be cheaper with your insurance), but the website can give you the price if you did not use any insurance.  - You can print the associated coupon and take it with your prescription to the pharmacy.  - You may also stop by our office during regular business hours and pick up a GoodRx coupon card.  - If you need your prescription sent electronically to a different pharmacy, notify our office through [facility] or by phone at [phone] option 4.      Pre-Operative Instructions  You are scheduled for a surgical procedure at [facility]. We recommend you read the following instructions. If you have any questions or concerns, please call the office at [phone].  1. Shower and wash the entire body with soap and water the day of your surgery paying special attention to cleansing at and around the planned surgery site.  2. Avoid aspirin or aspirin containing products  at least fourteen (14) days prior to your surgical procedure and for at least one week (7 Days) after your surgical procedure. If you take aspirin on a regular basis for heart disease or history of stroke or for any other reason, we may recommend you continue taking aspirin but please notify us if you take this on a regular basis. Aspirin can cause more bleeding to occur during surgery as well as prolonged bleeding and bruising after surgery.   3. Avoid other nonsteroidal pain  medications at least one week prior to surgery and at least one week prior to your surgery. These include medications such as Ibuprofen (Motrin, Advil and Nuprin), Naprosyn, Voltaren, Relafen, etc. If medications are used for therapeutic reasons, please inform us as they can cause increased bleeding or prolonged bleeding during and bruising after surgical procedures.   4. Please advise [place] if you are taking any "blood thinner" medications such as Coumadin or Dipyridamole or Plavix or similar medications. These cause increased bleeding and prolonged bleeding during procedures and bruising after surgical procedures. We may have to consider discontinuing these medications briefly prior to and shortly after your surgery if safe to do so.   5. Please inform us of all medications you are currently taking. All medications that are taken regularly should be taken the day of surgery as you always do. Nevertheless, we need to be informed of what medications you are taking prior to surgery to know whether they will affect the procedure or cause any complications.   6. Please inform us of any medication allergies. Also inform us of whether you have allergies to Latex or rubber products or whether you have had any adverse reaction to Lidocaine or Epinephrine.  7. Please inform us of any prosthetic or artificial body parts such as artificial heart valve, joint replacements, etc., or similar condition that might require preoperative antibiotics.   8. We recommend avoidance of alcohol at least two weeks prior to surgery and continued avoidance for at least two weeks after surgery.   9. We recommend discontinuation of tobacco smoking at least two weeks prior to surgery and continued abstinence for at least two weeks after surgery.  10. Do not plan strenuous exercise, strenuous work or strenuous lifting for approximately four weeks after your surgery.   11. We request if you are unable to make your scheduled surgical  appointment, please call us at least a week in advance or as soon as you are aware of a problem so that we can cancel or reschedule the appointment.   12. You MAY TAKE TYLENOL (acetaminophen) for pain as it is not a blood thinner.   13. PLEASE PLAN TO BE IN TOWN FOR TWO WEEKS FOLLOWING SURGERY, THIS IS IMPORTANT SO YOU CAN BE CHECKED FOR DRESSING CHANGES, SUTURE REMOVAL AND TO MONITOR FOR POSSIBLE COMPLICATIONS.    Wound Care Instructions  1. Cleanse wound gently with soap and water once a day then pat dry with clean gauze. Apply a thing coat of Petrolatum (petroleum jelly, "Vaseline") over the wound (unless you have an allergy to this). We recommend that you use a new, sterile tube of Vaseline. Do not pick or remove scabs. Do not remove the yellow or white "healing tissue" from the base of the wound.  2. Cover the wound with fresh, clean, nonstick gauze and secure with paper tape. You may use Band-Aids in place of gauze and tape if the would is small enough, but would recommend trimming much of the tape  off as there is often too much. Sometimes Band-Aids can irritate the skin.  3. You should call the office for your biopsy report after 1 week if you have not already been contacted.  4. If you experience any problems, such as abnormal amounts of bleeding, swelling, significant bruising, significant pain, or evidence of infection, please call the office immediately.  5. FOR ADULT SURGERY PATIENTS: If you need something for pain relief you may take 1 extra strength Tylenol (acetaminophen) AND 2 Ibuprofen (200mg  each) together every 4 hours as needed for pain. (do not take these if you are allergic to them or if you have a reason you should not take them.) Typically, you may only need pain medication for 1 to 3 days.

## 2020-09-27 NOTE — Progress Notes (Signed)
Follow-Up Visit   Subjective  Timothy Mccullough is a 50 y.o. male who presents for the following: Upper body skin exam (Hx of BCC R alar crease, hx of dysplastic nevi) and check spot (R side, ~ 85m). The patient presents for Upper Body Skin Exam (UBSE) for skin cancer screening and mole check.  The following portions of the chart were reviewed this encounter and updated as appropriate:   Tobacco  Allergies  Meds  Problems  Med Hx  Surg Hx  Fam Hx     Review of Systems:  No other skin or systemic complaints except as noted in HPI or Assessment and Plan.  Objective  Well appearing patient in no apparent distress; mood and affect are within normal limits.  All skin waist up examined.  Objective  R alar crease: Well healed scar with no evidence of recurrence.   Objective  multiple: Scars with no evidence of recurrence.   Objective  L dorsum hand x 1: Erythematous keratotic or waxy stuck-on papule or plaque.   Objective  Right Flank: Cystic pap 1.0cm  Objective  Right mid back lat near the side: 1.2cm irregular brown macule   Assessment & Plan    Lentigines - Scattered tan macules - Due to sun exposure - Benign-appering, observe - Recommend daily broad spectrum sunscreen SPF 30+ to sun-exposed areas, reapply every 2 hours as needed. - Call for any changes  Seborrheic Keratoses - Stuck-on, waxy, tan-brown papules and plaques  - Discussed benign etiology and prognosis. - Observe - Call for any changes  Melanocytic Nevi - Tan-brown and/or pink-flesh-colored symmetric macules and papules - Benign appearing on exam today - Observation - Call clinic for new or changing moles - Recommend daily use of broad spectrum spf 30+ sunscreen to sun-exposed areas.   Hemangiomas - Red papules - Discussed benign nature - Observe - Call for any changes  Actinic Damage - Chronic, secondary to cumulative UV/sun exposure - diffuse scaly erythematous macules with underlying  dyspigmentation - Recommend daily broad spectrum sunscreen SPF 30+ to sun-exposed areas, reapply every 2 hours as needed.  - Call for new or changing lesions.  Skin cancer screening performed today.  Family history of Melanoma  History of basal cell carcinoma (BCC) R alar crease  Clear. Observe for recurrence. Call clinic for new or changing lesions.  Recommend regular skin exams, daily broad-spectrum spf 30+ sunscreen use, and photoprotection.     History of dysplastic nevus multiple  Clear. Observe for recurrence. Call clinic for new or changing lesions.  Recommend regular skin exams, daily broad-spectrum spf 30+ sunscreen use, and photoprotection.     Inflamed seborrheic keratosis L dorsum hand x 1  Destruction of lesion - L dorsum hand x 1 Complexity: simple   Destruction method: cryotherapy   Informed consent: discussed and consent obtained   Timeout:  patient name, date of birth, surgical site, and procedure verified Lesion destroyed using liquid nitrogen: Yes   Region frozen until ice ball extended beyond lesion: Yes   Outcome: patient tolerated procedure well with no complications   Post-procedure details: wound care instructions given    Epidermal cyst Right Flank  Benign, discussed excising, pt will schedule  Neoplasm of skin Right mid back lat near the side  Epidermal / dermal shaving  Lesion diameter (cm):  1.2 Informed consent: discussed and consent obtained   Timeout: patient name, date of birth, surgical site, and procedure verified   Procedure prep:  Patient was prepped and draped in  usual sterile fashion Prep type:  Isopropyl alcohol Anesthesia: the lesion was anesthetized in a standard fashion   Anesthetic:  1% lidocaine w/ epinephrine 1-100,000 buffered w/ 8.4% NaHCO3 Instrument used: flexible razor blade   Hemostasis achieved with: pressure, aluminum chloride and electrodesiccation   Outcome: patient tolerated procedure well   Post-procedure  details: sterile dressing applied and wound care instructions given   Dressing type: bandage and petrolatum    Specimen 1 - Surgical pathology Differential Diagnosis: D48.5 Nevus vs Dysplastic Nevus  Check Margins: yes 1.2cm irregular brown macule  Return for to be scheduled for surgery cyst R flank, 35m UBSE hx of BCC, Dysplastic .  I, Othelia Pulling, RMA, am acting as scribe for Sarina Ser, MD .  Documentation: I have reviewed the above documentation for accuracy and completeness, and I agree with the above.  Sarina Ser, MD

## 2020-10-08 ENCOUNTER — Telehealth: Payer: Self-pay

## 2020-10-08 ENCOUNTER — Encounter: Payer: Self-pay | Admitting: Dermatology

## 2020-10-08 NOTE — Telephone Encounter (Signed)
-----   Message from Ralene Bathe, MD sent at 10/05/2020  2:30 PM EDT ----- Diagnosis Skin , right mid back lat near the side MALIGNANT MELANOMA IN SITU, LATERAL MARGINS INVOLVED, SEE DESCRIPTION  Cancer - Melanoma in situ Superficial and early Schedule for surgery (wide local excision) Tried to call pt.  No answer and voicemail box was full.  I will discuss w pt when we can get a good # to call him at.

## 2020-10-08 NOTE — Telephone Encounter (Signed)
Advised patient of results and scheduled for melanoma matrix and surgery 10/23/2020 at 8:30/hd

## 2020-10-23 ENCOUNTER — Other Ambulatory Visit: Payer: Self-pay

## 2020-10-23 ENCOUNTER — Encounter: Payer: Self-pay | Admitting: Dermatology

## 2020-10-23 ENCOUNTER — Ambulatory Visit: Payer: BC Managed Care – PPO | Admitting: Dermatology

## 2020-10-23 ENCOUNTER — Telehealth: Payer: Self-pay

## 2020-10-23 DIAGNOSIS — Z1283 Encounter for screening for malignant neoplasm of skin: Secondary | ICD-10-CM

## 2020-10-23 DIAGNOSIS — D229 Melanocytic nevi, unspecified: Secondary | ICD-10-CM | POA: Diagnosis not present

## 2020-10-23 DIAGNOSIS — L821 Other seborrheic keratosis: Secondary | ICD-10-CM | POA: Diagnosis not present

## 2020-10-23 DIAGNOSIS — L578 Other skin changes due to chronic exposure to nonionizing radiation: Secondary | ICD-10-CM

## 2020-10-23 DIAGNOSIS — D1800 Hemangioma unspecified site: Secondary | ICD-10-CM

## 2020-10-23 DIAGNOSIS — L814 Other melanin hyperpigmentation: Secondary | ICD-10-CM

## 2020-10-23 DIAGNOSIS — D0359 Melanoma in situ of other part of trunk: Secondary | ICD-10-CM | POA: Diagnosis not present

## 2020-10-23 MED ORDER — DOXYCYCLINE HYCLATE 100 MG PO TABS: 100.0000 mg | ORAL_TABLET | Freq: Two times a day (BID) | ORAL | 0 refills | Status: AC

## 2020-10-23 MED ORDER — MUPIROCIN 2 % EX OINT: 1.0000 "application " | TOPICAL_OINTMENT | Freq: Every day | CUTANEOUS | 0 refills | Status: DC

## 2020-10-23 NOTE — Progress Notes (Signed)
Follow-Up Visit   Subjective  Timothy Mccullough is a 50 y.o. 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 The patient presents for Total-Body Skin Exam (TBSE) for skin cancer screening and mole check.  The following portions of the chart were reviewed this encounter and updated as appropriate:   Tobacco  Allergies  Meds  Problems  Med Hx  Surg Hx  Fam Hx     Review of Systems:  No other skin or systemic complaints except as noted in HPI or Assessment and Plan.  Objective  Well appearing patient in no apparent distress; mood and affect are within normal limits.  A full examination was performed including scalp, head, eyes, ears, nose, lips, neck, chest, axillae, abdomen, back, buttocks, bilateral upper extremities, bilateral lower extremities, hands, feet, fingers, toes, fingernails, and toenails. All findings within normal limits unless otherwise noted below.  Objective  Right mid back lat near side: Healing biopsy site   Assessment & Plan    Lentigines - Scattered tan macules - Due to sun exposure - Benign-appering, observe - Recommend daily broad spectrum sunscreen SPF 30+ to sun-exposed areas, reapply every 2 hours as needed. - Call for any changes  Seborrheic Keratoses - Stuck-on, waxy, tan-brown papules and/or plaques  - Benign-appearing - Discussed benign etiology and prognosis. - Observe - Call for any changes  Melanocytic Nevi - Tan-brown and/or pink-flesh-colored symmetric macules and papules - Benign appearing on exam today - Observation - Call clinic for new or changing moles - Recommend daily use of broad spectrum spf 30+ sunscreen to sun-exposed areas.   Hemangiomas - Red papules - Discussed benign nature - Observe - Call for any changes  Actinic Damage - Chronic condition, secondary to cumulative UV/sun exposure - diffuse scaly erythematous macules with  underlying dyspigmentation - Recommend daily broad spectrum sunscreen SPF 30+ to sun-exposed areas, reapply every 2 hours as needed.  - Staying in the shade or wearing long sleeves, sun glasses (UVA+UVB protection) and wide brim hats (4-inch brim around the entire circumference of the hat) are also recommended for sun protection.  - Call for new or changing lesions.  Skin cancer screening performed today.  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  Skin excision  Lesion length (cm):  1.5 Lesion width (cm):  1 Margin per side (cm):  0.5 Total excision diameter (cm):  2.5 Informed consent: discussed and consent obtained   Timeout: patient name, date of birth, surgical site, and procedure verified   Procedure prep:  Patient was prepped and draped in usual sterile fashion Prep type:  Isopropyl alcohol and povidone-iodine Anesthesia: the lesion was anesthetized in a standard fashion   Anesthetic:  1% lidocaine w/ epinephrine 1-100,000 buffered w/ 8.4% NaHCO3 (Bupivicaine 0.75%  - 8cc) Instrument used: #15 blade   Hemostasis achieved with: pressure   Hemostasis achieved with comment:  Electrocautery Outcome: patient tolerated procedure well with no complications   Post-procedure details: sterile dressing applied and wound care instructions given   Dressing type: bandage and pressure dressing (mupirocin)    Skin repair Complexity:  Complex Final length (cm):  6 Reason for type of repair: reduce tension to allow closure, reduce the risk of dehiscence, infection, and necrosis, reduce subcutaneous dead space and avoid a hematoma, allow closure of the large defect, preserve normal anatomy, preserve normal anatomical and functional relationships and enhance both  functionality and cosmetic results   Undermining: area extensively undermined   Undermining comment:  Undermining defect 2.5 Subcutaneous layers (deep stitches):  Suture size:  2-0 Suture type:  Vicryl (polyglactin 910)   Subcutaneous suture technique: inverted dermal. Fine/surface layer approximation (top stitches):  Suture size:  2-0 Suture type: nylon   Stitches: simple running   Suture removal (days):  7 Hemostasis achieved with: suture and pressure Outcome: patient tolerated procedure well with no complications   Post-procedure details: sterile dressing applied and wound care instructions given   Dressing type: bandage and pressure dressing (mupirocin)   Additional details:  Doxycyline 100 mg 1 po bid with food and plenty of fluid x 7 days, Mupirocin oint qd with dressing changes.  mupirocin ointment (BACTROBAN) 2 %  doxycycline (VIBRA-TABS) 100 MG tablet  Specimen 1 - Surgical pathology Differential Diagnosis: Biopsy proven Melanoma in situ Check Margins: Yes Healing biopsy site [id number]  Discussed diagnosis in detail including significance of melanoma diagnosis which can be potentially lethal.  Discussed treatment recommendations in detail advising that treatment recommendations are based on longitudinal studies and retrospective studies and are nationwide protocols.  Advised there is always potential for recurrence even after definitive treatment.  After definitive treatment, we recommend total-body skin exams every 3 months for a year; then every 4 months for a year; then every 6 months for 3 years.  At 5 years post treatment, if all looks good we would recommend at least yearly total-body skin exams for the rest of your life.  The patient was given time for questions and these were answered.  We recommend frequent self skin examinations; photoprotection with sunscreen, sun protective clothing, hats, sunglasses and sun avoidance.  If the patient notices any new or changing skin lesions the patient should return to the office immediately for evaluation.    Skin cancer screening  Return in about 1 week (around 10/30/2020) for suture removal  then 3 months TBSE.  I,  Ashok Cordia, CMA, am acting as scribe for Sarina Ser, MD .  Documentation: I have reviewed the above documentation for accuracy and completeness, and I agree with the above.  Sarina Ser, MD

## 2020-10-23 NOTE — Patient Instructions (Addendum)
Wound Care Instructions  1. Cleanse wound gently with soap and water once a day then pat dry with clean gauze. Apply a thing coat of Petrolatum (petroleum jelly, "Vaseline") over the wound (unless you have an allergy to this). We recommend that you use a new, sterile tube of Vaseline. Do not pick or remove scabs. Do not remove the yellow or white "healing tissue" from the base of the wound.  2. Cover the wound with fresh, clean, nonstick gauze and secure with paper tape. You may use Band-Aids in place of gauze and tape if the would is small enough, but would recommend trimming much of the tape off as there is often too much. Sometimes Band-Aids can irritate the skin.  3. You should call the office for your biopsy report after 1 week if you have not already been contacted.  4. If you experience any problems, such as abnormal amounts of bleeding, swelling, significant bruising, significant pain, or evidence of infection, please call the office immediately.  5. FOR ADULT SURGERY PATIENTS: If you need something for pain relief you may take 1 extra strength Tylenol (acetaminophen) AND 2 Ibuprofen (200mg  each) together every 4 hours as needed for pain. (do not take these if you are allergic to them or if you have a reason you should not take them.) Typically, you may only need pain medication for 1 to 3 days.    Discussed diagnosis in detail including significance of melanoma diagnosis which can be potentially lethal.  Discussed treatment recommendations in detail advising that treatment recommendations are based on longitudinal studies and retrospective studies and are nationwide protocols.  Advised there is always potential for recurrence even after definitive treatment.  After definitive treatment, we recommend total-body skin exams every 3 months for a year; then every 4 months for a year; then every 6 months for 3 years.  At 5 years post treatment, if all looks good we would recommend at least yearly  total-body skin exams for the rest of your life.  The patient was given time for questions and these were answered.  We recommend frequent self skin examinations; photoprotection with sunscreen, sun protective clothing, hats, sunglasses and sun avoidance.  If the patient notices any new or changing skin lesions the patient should return to the office immediately for evaluation.    If you have any questions or concerns for your doctor, please call our main line at [phone] and press option 4 to reach your doctor's medical assistant. If no one answers, please leave a voicemail as directed and we will return your call as soon as possible. Messages left after 4 pm will be answered the following business day.   You may also send [place] a message via [place]. We typically respond to MyChart messages within 1-2 business days.  For prescription refills, please ask your pharmacy to contact our office. Our fax number is [phone].  If you have an urgent issue when the clinic is closed that cannot wait until the next business day, you can page your doctor at the number below.    Please note that while we do our best to be available for urgent issues outside of office hours, we are not available 24/7.   If you have an urgent issue and are unable to reach [place], you may choose to seek medical care at your doctor's office, retail clinic, urgent care center, or emergency room.  If you have a medical emergency, please immediately call 911 or go to the emergency  department.  Pager Numbers  - Dr. Nehemiah Massed: [phone]  - Dr. Laurence Ferrari: [phone]  - Dr. Nicole Kindred: [phone]  In the event of inclement weather, please call our main line at [phone] for an update on the status of any delays or closures.  Dermatology Medication Tips: Please keep the boxes that topical medications come in in order to help keep track of the instructions about where and how to use these. Pharmacies typically print the  medication instructions only on the boxes and not directly on the medication tubes.   If your medication is too expensive, please contact our office at [phone] option 4 or send [place] a message through [place].   We are unable to tell what your co-pay for medications will be in advance as this is different depending on your insurance coverage. However, we may be able to find a substitute medication at lower cost or fill out paperwork to get insurance to cover a needed medication.   If a prior authorization is required to get your medication covered by your insurance company, please allow [place] 1-2 business days to complete this process.  Drug prices often vary depending on where the prescription is filled and some pharmacies may offer cheaper prices.  The website www.goodrx.com contains coupons for medications through different pharmacies. The prices here do not account for what the cost may be with help from insurance (it may be cheaper with your insurance), but the website can give you the price if you did not use any insurance.  - You can print the associated coupon and take it with your prescription to the pharmacy.  - You may also stop by our office during regular business hours and pick up a GoodRx coupon card.  - If you need your prescription sent electronically to a different pharmacy, notify our office through [facility] or by phone at [phone] option 4.

## 2020-10-23 NOTE — Telephone Encounter (Signed)
Spoke with patient regarding surgery. He is doing fine/hd  

## 2020-10-25 ENCOUNTER — Telehealth: Payer: Self-pay

## 2020-10-25 NOTE — Telephone Encounter (Signed)
Advised patient of results/hd  

## 2020-10-25 NOTE — Telephone Encounter (Signed)
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

## 2020-10-30 ENCOUNTER — Encounter: Payer: Self-pay | Admitting: Dermatology

## 2020-10-30 ENCOUNTER — Ambulatory Visit (INDEPENDENT_AMBULATORY_CARE_PROVIDER_SITE_OTHER): Payer: BC Managed Care – PPO | Admitting: Dermatology

## 2020-10-30 ENCOUNTER — Other Ambulatory Visit: Payer: Self-pay

## 2020-10-30 DIAGNOSIS — Z86006 Personal history of melanoma in-situ: Secondary | ICD-10-CM

## 2020-10-30 DIAGNOSIS — L720 Epidermal cyst: Secondary | ICD-10-CM

## 2020-10-30 NOTE — Patient Instructions (Signed)

## 2020-10-30 NOTE — Progress Notes (Signed)
   Follow-Up Visit   Subjective  Timothy Mccullough is a 50 y.o. male who presents for the following: Follow-up (Post op Melanoma in situ Margins Free of right mid back lat near side).  The following portions of the chart were reviewed this encounter and updated as appropriate:   Tobacco  Allergies  Meds  Problems  Med Hx  Surg Hx  Fam Hx     Review of Systems:  No other skin or systemic complaints except as noted in HPI or Assessment and Plan.  Objective  Well appearing patient in no apparent distress; mood and affect are within normal limits.  A focused examination was performed including back. Relevant physical exam findings are noted in the Assessment and Plan.  Objective  Right mid back lat near side: Healing excision site  Objective  Right Flank: Subcutaneous nodule.    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  - Discussed pathology results showing melanoma in situ margins free  - Patient advised to keep steri-strips dry until they fall off. - Scars remodel for a full year. - Once steri-strips fall off, patient can apply over-the-counter silicone scar cream each night to help with scar remodeling if desired. - Patient advised to call with any concerns or if they notice any new or changing lesions.   Epidermal inclusion cyst Right Flank  Patient is scheduled for excision 03/2021.  Return in about 2 months (around 12/30/2020) for TBSE.   I, Ashok Cordia, CMA, am acting as scribe for Sarina Ser, MD .  Documentation: I have reviewed the above documentation for accuracy and completeness, and I agree with the above.  Sarina Ser, MD

## 2020-12-20 ENCOUNTER — Ambulatory Visit: Payer: BC Managed Care – PPO | Admitting: Dermatology

## 2020-12-20 ENCOUNTER — Other Ambulatory Visit: Payer: Self-pay

## 2020-12-20 DIAGNOSIS — L820 Inflamed seborrheic keratosis: Secondary | ICD-10-CM | POA: Diagnosis not present

## 2020-12-20 DIAGNOSIS — Z86018 Personal history of other benign neoplasm: Secondary | ICD-10-CM

## 2020-12-20 DIAGNOSIS — Z1283 Encounter for screening for malignant neoplasm of skin: Secondary | ICD-10-CM | POA: Diagnosis not present

## 2020-12-20 DIAGNOSIS — D229 Melanocytic nevi, unspecified: Secondary | ICD-10-CM

## 2020-12-20 DIAGNOSIS — D225 Melanocytic nevi of trunk: Secondary | ICD-10-CM

## 2020-12-20 DIAGNOSIS — D239 Other benign neoplasm of skin, unspecified: Secondary | ICD-10-CM

## 2020-12-20 DIAGNOSIS — L578 Other skin changes due to chronic exposure to nonionizing radiation: Secondary | ICD-10-CM

## 2020-12-20 DIAGNOSIS — D492 Neoplasm of unspecified behavior of bone, soft tissue, and skin: Secondary | ICD-10-CM

## 2020-12-20 DIAGNOSIS — Z86006 Personal history of melanoma in-situ: Secondary | ICD-10-CM

## 2020-12-20 DIAGNOSIS — B079 Viral wart, unspecified: Secondary | ICD-10-CM

## 2020-12-20 DIAGNOSIS — L814 Other melanin hyperpigmentation: Secondary | ICD-10-CM

## 2020-12-20 DIAGNOSIS — L821 Other seborrheic keratosis: Secondary | ICD-10-CM

## 2020-12-20 DIAGNOSIS — D1800 Hemangioma unspecified site: Secondary | ICD-10-CM

## 2020-12-20 HISTORY — DX: Other benign neoplasm of skin, unspecified: D23.9

## 2020-12-20 NOTE — Progress Notes (Signed)
Follow-Up Visit   Subjective  Timothy Mccullough is a 50 y.o. male who presents for the following: Total body skin exam (Hx of Melanoma IS R mid back lat near side, hx of dysplastic nevi). The patient presents for Total-Body Skin Exam (TBSE) for skin cancer screening and mole check.  The following portions of the chart were reviewed this encounter and updated as appropriate:   Tobacco  Allergies  Meds  Problems  Med Hx  Surg Hx  Fam Hx     Review of Systems:  No other skin or systemic complaints except as noted in HPI or Assessment and Plan.  Objective  Well appearing patient in no apparent distress; mood and affect are within normal limits.  A full examination was performed including scalp, head, eyes, ears, nose, lips, neck, chest, axillae, abdomen, back, buttocks, bilateral upper extremities, bilateral lower extremities, hands, feet, fingers, toes, fingernails, and toenails. All findings within normal limits unless otherwise noted below.  Objective  Right mid back lateral near side: Scare clear to visual exam and palpation, no lymphadenopathy  Objective  multiple: Scars with no evidence of recurrence.   Objective  Right mid back paraspinal,: 0.6cm irregular brown macule  Objective  Left dorsum hand between the middle finger and 4th finger MCP x 1: Verrucous papules -- Discussed viral etiology and contagion. 1.0cm  Objective  Right Forearm x 1, L forearm x 1 (2): Erythematous keratotic or waxy stuck-on papule or plaque.    Assessment & Plan    Lentigines - Scattered tan macules - Due to sun exposure - Benign-appering, observe - Recommend daily broad spectrum sunscreen SPF 30+ to sun-exposed areas, reapply every 2 hours as needed. - Call for any changes  Seborrheic Keratoses - Stuck-on, waxy, tan-brown papules and/or plaques  - Benign-appearing - Discussed benign etiology and prognosis. - Observe - Call for any changes  Melanocytic Nevi - Tan-brown and/or  pink-flesh-colored symmetric macules and papules - Benign appearing on exam today - Observation - Call clinic for new or changing moles - Recommend daily use of broad spectrum spf 30+ sunscreen to sun-exposed areas.   Hemangiomas - Red papules - Discussed benign nature - Observe - Call for any changes  Actinic Damage - Chronic condition, secondary to cumulative UV/sun exposure - diffuse scaly erythematous macules with underlying dyspigmentation - Recommend daily broad spectrum sunscreen SPF 30+ to sun-exposed areas, reapply every 2 hours as needed.  - Staying in the shade or wearing long sleeves, sun glasses (UVA+UVB protection) and wide brim hats (4-inch brim around the entire circumference of the hat) are also recommended for sun protection.  - Call for new or changing lesions.  Skin cancer screening performed today.  History of melanoma in situ Right mid back lateral near side  Clear. Observe for recurrence. Call clinic for new or changing lesions.  Recommend regular skin exams, daily broad-spectrum spf 30+ sunscreen use, and photoprotection.     History of dysplastic nevus multiple  Clear. Observe for recurrence. Call clinic for new or changing lesions.  Recommend regular skin exams, daily broad-spectrum spf 30+ sunscreen use, and photoprotection.     Neoplasm of skin Right mid back paraspinal,  Epidermal / dermal shaving  Lesion diameter (cm):  0.6 Informed consent: discussed and consent obtained   Timeout: patient name, date of birth, surgical site, and procedure verified   Procedure prep:  Patient was prepped and draped in usual sterile fashion Prep type:  Isopropyl alcohol Anesthesia: the lesion was anesthetized in a  standard fashion   Anesthetic:  1% lidocaine w/ epinephrine 1-100,000 buffered w/ 8.4% NaHCO3 Instrument used: flexible razor blade   Hemostasis achieved with: pressure, aluminum chloride and electrodesiccation   Outcome: patient tolerated procedure  well   Post-procedure details: sterile dressing applied and wound care instructions given   Dressing type: bandage and petrolatum    Specimen 1 - Surgical pathology Differential Diagnosis: D48.5 Nevus vs Dysplastic Nevus  Check Margins: yes 0.6cm irregular brown macule  Viral warts, unspecified type Left dorsum hand between the middle finger and 4th finger MCP x 1  LN2 x 1 Squaric Acid 3% c 1 Cantharidin plus x 1  Destruction of lesion - Left dorsum hand between the middle finger and 4th finger MCP x 1 Complexity: simple   Destruction method: cryotherapy   Informed consent: discussed and consent obtained   Timeout:  patient name, date of birth, surgical site, and procedure verified Lesion destroyed using liquid nitrogen: Yes   Region frozen until ice ball extended beyond lesion: Yes   Outcome: patient tolerated procedure well with no complications   Post-procedure details: wound care instructions given    Destruction of lesion - Left dorsum hand between the middle finger and 4th finger MCP x 1  Destruction method: chemical removal   Informed consent: discussed and consent obtained   Timeout:  patient name, date of birth, surgical site, and procedure verified Chemical destruction method comment:  Squaric Acid 3%, Cantharidin plus Procedure instructions: patient instructed to wash and dry area   Outcome: patient tolerated procedure well with no complications   Post-procedure details: wound care instructions given   Additional details:  Patient advised to set alarm to remind them to wash off with soap and water at the directed time.  Inflamed seborrheic keratosis (2) Right Forearm x 1, L forearm x 1  Destruction of lesion - Right Forearm x 1, L forearm x 1 Complexity: simple   Destruction method: cryotherapy   Informed consent: discussed and consent obtained   Timeout:  patient name, date of birth, surgical site, and procedure verified Lesion destroyed using liquid  nitrogen: Yes   Region frozen until ice ball extended beyond lesion: Yes   Outcome: patient tolerated procedure well with no complications   Post-procedure details: wound care instructions given    Return in about 3 months (around 03/22/2021) for TBSE, Hx of Melanoma IS, Hx of Dysplastic nevi.  I, Othelia Pulling, RMA, am acting as scribe for Sarina Ser, MD .  Documentation: I have reviewed the above documentation for accuracy and completeness, and I agree with the above.  Sarina Ser, MD

## 2020-12-20 NOTE — Patient Instructions (Addendum)
If you have any questions or concerns for your doctor, please call our main line at [phone] and press option 4 to reach your doctor's medical assistant. If no one answers, please leave a voicemail as directed and we will return your call as soon as possible. Messages left after 4 pm will be answered the following business day.   You may also send us a message via MyChart. We typically respond to MyChart messages within 1-2 business days.  For prescription refills, please ask your pharmacy to contact our office. Our fax number is [phone].  If you have an urgent issue when the clinic is closed that cannot wait until the next business day, you can page your doctor at the number below.    Please note that while we do our best to be available for urgent issues outside of office hours, we are not available 24/7.   If you have an urgent issue and are unable to reach us, you may choose to seek medical care at your doctor's office, retail clinic, urgent care center, or emergency room.  If you have a medical emergency, please immediately call 911 or go to the emergency department.  Pager Numbers  - Dr. Kowalski: [phone]  - Dr. Moye: [phone]  - Dr. Stewart: [phone]  In the event of inclement weather, please call our main line at [phone] for an update on the status of any delays or closures.  Dermatology Medication Tips: Please keep the boxes that topical medications come in in order to help keep track of the instructions about where and how to use these. Pharmacies typically print the medication instructions only on the boxes and not directly on the medication tubes.   If your medication is too expensive, please contact our office at [phone] option 4 or send us a message through MyChart.   We are unable to tell what your co-pay for medications will be in advance as this is different depending on your insurance coverage. However, we may be able to find a  substitute medication at lower cost or fill out paperwork to get insurance to cover a needed medication.   If a prior authorization is required to get your medication covered by your insurance company, please allow us 1-2 business days to complete this process.  Drug prices often vary depending on where the prescription is filled and some pharmacies may offer cheaper prices.  The website www.goodrx.com contains coupons for medications through different pharmacies. The prices here do not account for what the cost may be with help from insurance (it may be cheaper with your insurance), but the website can give you the price if you did not use any insurance.  - You can print the associated coupon and take it with your prescription to the pharmacy.  - You may also stop by our office during regular business hours and pick up a GoodRx coupon card.  - If you need your prescription sent electronically to a different pharmacy, notify our office through [facility] MyChart or by phone at [phone] option 4.     Wound Care Instructions  1. Cleanse wound gently with soap and water once a day then pat dry with clean gauze. Apply a thing coat of Petrolatum (petroleum jelly, "Vaseline") over the wound (unless you have an allergy to this). We recommend that you use a new, sterile tube of Vaseline. Do not pick or remove scabs. Do not remove the yellow or white "healing tissue" from the base of the wound.    Cover the wound with fresh, clean, nonstick gauze and secure with paper tape. You may use Band-Aids in place of gauze and tape if the would is small enough, but would recommend trimming much of the tape off as there is often too much. Sometimes Band-Aids can irritate the skin.  3. You should call the office for your biopsy report after 1 week if you have not already been contacted.  4. If you experience any problems, such as abnormal amounts of bleeding, swelling, significant bruising, significant pain, or evidence of  infection, please call the office immediately.  5. FOR ADULT SURGERY PATIENTS: If you need something for pain relief you may take 1 extra strength Tylenol (acetaminophen) AND 2 Ibuprofen (200mg  each) together every 4 hours as needed for pain. (do not take these if you are allergic to them or if you have a reason you should not take them.) Typically, you may only need pain medication for 1 to 3 days.    Instructions for After In-Office Application of Cantharidin  1. This is a strong medicine; please follow ALL instructions.  2. Gently wash off with soap and water in four hours or sooner s directed by your physician.  3. **WARNING** this medicine can cause severe blistering, blood blisters, infection, and/or scarring if it is not washed off as directed.  4. Your progress will be rechecked in 1-2 months; call sooner if there are any questions or problems.

## 2020-12-21 ENCOUNTER — Encounter: Payer: Self-pay | Admitting: Dermatology

## 2020-12-27 ENCOUNTER — Telehealth: Payer: Self-pay

## 2020-12-27 NOTE — Telephone Encounter (Signed)
-----   Message from Ralene Bathe, MD sent at 12/27/2020 10:35 AM EDT ----- Diagnosis Skin , right mid back paraspinal DYSPLASTIC COMPOUND NEVUS WITH SEVERE ATYPIA, CLOSE TO MARGIN  Severe Dysplastic Margins clear but close. May need additional procedure Will review at next visit Sept 2022 and discuss if further action needed.

## 2020-12-27 NOTE — Telephone Encounter (Signed)
Advised patient of results. Patient states that he is more comfortable being seen every 2 months instead of every 3 months so I moved his follow up appointment to 03/04/21/hd

## 2021-03-04 ENCOUNTER — Other Ambulatory Visit: Payer: Self-pay

## 2021-03-04 ENCOUNTER — Ambulatory Visit: Payer: BC Managed Care – PPO | Admitting: Dermatology

## 2021-03-04 DIAGNOSIS — L821 Other seborrheic keratosis: Secondary | ICD-10-CM

## 2021-03-04 DIAGNOSIS — Z1283 Encounter for screening for malignant neoplasm of skin: Secondary | ICD-10-CM

## 2021-03-04 DIAGNOSIS — Z85828 Personal history of other malignant neoplasm of skin: Secondary | ICD-10-CM

## 2021-03-04 DIAGNOSIS — D229 Melanocytic nevi, unspecified: Secondary | ICD-10-CM

## 2021-03-04 DIAGNOSIS — Z86018 Personal history of other benign neoplasm: Secondary | ICD-10-CM

## 2021-03-04 DIAGNOSIS — L814 Other melanin hyperpigmentation: Secondary | ICD-10-CM

## 2021-03-04 DIAGNOSIS — I781 Nevus, non-neoplastic: Secondary | ICD-10-CM

## 2021-03-04 DIAGNOSIS — D1800 Hemangioma unspecified site: Secondary | ICD-10-CM

## 2021-03-04 DIAGNOSIS — Z86006 Personal history of melanoma in-situ: Secondary | ICD-10-CM | POA: Diagnosis not present

## 2021-03-04 DIAGNOSIS — L578 Other skin changes due to chronic exposure to nonionizing radiation: Secondary | ICD-10-CM

## 2021-03-04 NOTE — Patient Instructions (Addendum)

## 2021-03-04 NOTE — Progress Notes (Signed)
Follow-Up Visit   Subjective  Timothy Mccullough is a 50 y.o. male who presents for the following: 3 month (Patient here today for 3 month tbse. He has history of skin cancer. He reports no new concerns today at follow up. ). Patient here for full body skin exam and skin cancer screening.   The following portions of the chart were reviewed this encounter and updated as appropriate:  Tobacco  Allergies  Meds  Problems  Med Hx  Surg Hx  Fam Hx      Objective  Well appearing patient in no apparent distress; mood and affect are within normal limits.  A full examination was performed including scalp, head, eyes, ears, nose, lips, neck, chest, axillae, abdomen, back, buttocks, bilateral upper extremities, bilateral lower extremities, hands, feet, fingers, toes, fingernails, and toenails. All findings within normal limits unless otherwise noted below.   Assessment & Plan  Skin cancer screening  Telangiectasia - Dilated blood vessel at face  Rosacea vs other Chronic and persistent BBL /laser- suggest treatment for in fall/winter  - Benign appearing on exam - Call for changes  Lentigines - Scattered tan macules - Due to sun exposure - Benign-appering, observe - Recommend daily broad spectrum sunscreen SPF 30+ to sun-exposed areas, reapply every 2 hours as needed. - Call for any changes  Seborrheic Keratoses - Stuck-on, waxy, tan-brown papules and/or plaques  - Benign-appearing - Discussed benign etiology and prognosis. - Observe - Call for any changes  Melanocytic Nevi - Tan-brown and/or pink-flesh-colored symmetric macules and papules - Benign appearing on exam today - Observation - Call clinic for new or changing moles - Recommend daily use of broad spectrum spf 30+ sunscreen to sun-exposed areas.   Hemangiomas - Red papules - Discussed benign nature - Observe - Call for any changes  Actinic Damage - Chronic condition, secondary to cumulative UV/sun exposure -  diffuse scaly erythematous macules with underlying dyspigmentation - Recommend daily broad spectrum sunscreen SPF 30+ to sun-exposed areas, reapply every 2 hours as needed.  - Staying in the shade or wearing long sleeves, sun glasses (UVA+UVB protection) and wide brim hats (4-inch brim around the entire circumference of the hat) are also recommended for sun protection.  - Call for new or changing lesions.  History of Melanoma in Situ - No evidence of recurrence today right mid back lateral near side (2022) - Recommend regular full body skin exams - Recommend daily broad spectrum sunscreen SPF 30+ to sun-exposed areas, reapply every 2 hours as needed.  - Call if any new or changing lesions are noted between office visits  History of Dysplastic Nevi - No evidence of recurrence today multiple sites refer to history at right mid back paraspinal (2022) - Recommend regular full body skin exams - Recommend daily broad spectrum sunscreen SPF 30+ to sun-exposed areas, reapply every 2 hours as needed.  - Call if any new or changing lesions are noted between office visits  History of Basal Cell Carcinoma of the Skin - No evidence of recurrence today at right alara crease (2021) - Recommend regular full body skin exams - Recommend daily broad spectrum sunscreen SPF 30+ to sun-exposed areas, reapply every 2 hours as needed.  - Call if any new or changing lesions are noted between office visits  Skin cancer screening performed today.  Return in about 3 months (around 06/04/2021) for tbse / history of melanoma in-situ. Garry Heater, CMA, am acting as scribe for Sarina Ser, MD. Documentation: I have reviewed  the above documentation for accuracy and completeness, and I agree with the above.  Sarina Ser, MD

## 2021-03-09 ENCOUNTER — Encounter: Payer: Self-pay | Admitting: Dermatology

## 2021-04-02 ENCOUNTER — Other Ambulatory Visit: Payer: Self-pay

## 2021-04-02 ENCOUNTER — Telehealth: Payer: Self-pay

## 2021-04-02 ENCOUNTER — Ambulatory Visit: Payer: BC Managed Care – PPO | Admitting: Dermatology

## 2021-04-02 DIAGNOSIS — L7211 Pilar cyst: Secondary | ICD-10-CM | POA: Diagnosis not present

## 2021-04-02 DIAGNOSIS — D492 Neoplasm of unspecified behavior of bone, soft tissue, and skin: Secondary | ICD-10-CM

## 2021-04-02 MED ORDER — MUPIROCIN 2 % EX OINT
1.0000 | TOPICAL_OINTMENT | Freq: Every day | CUTANEOUS | 1 refills | Status: DC
Start: 2021-04-02 — End: 2023-05-18

## 2021-04-02 NOTE — Telephone Encounter (Signed)
Patient doing fine after today's surgery./sh 

## 2021-04-02 NOTE — Progress Notes (Signed)
   Follow-Up Visit   Subjective  Timothy Mccullough is a 50 y.o. male who presents for the following: Cyst (R flank, pt presents for excision today).  The following portions of the chart were reviewed this encounter and updated as appropriate:   Tobacco  Allergies  Meds  Problems  Med Hx  Surg Hx  Fam Hx     Review of Systems:  No other skin or systemic complaints except as noted in HPI or Assessment and Plan.  Objective  Well appearing patient in no apparent distress; mood and affect are within normal limits.  A focused examination was performed including R side. Relevant physical exam findings are noted in the Assessment and Plan.  Right Flank Cystic pap 1.2cm   Assessment & Plan  Neoplasm of skin Right Flank  Skin excision  Lesion length (cm):  1.2 Lesion width (cm):  1.2 Margin per side (cm):  0 Total excision diameter (cm):  1.2 Informed consent: discussed and consent obtained   Timeout: patient name, date of birth, surgical site, and procedure verified   Procedure prep:  Patient was prepped and draped in usual sterile fashion Prep type:  Isopropyl alcohol and povidone-iodine Anesthesia: the lesion was anesthetized in a standard fashion   Anesthetic:  1% lidocaine w/ epinephrine 1-100,000 buffered w/ 8.4% NaHCO3 (3cc lido w/ epi, 3cc bupivicaine, Total of 6cc) Instrument used: #15 blade   Hemostasis achieved with: pressure   Hemostasis achieved with comment:  Electrocautery Outcome: patient tolerated procedure well with no complications   Post-procedure details: sterile dressing applied and wound care instructions given   Dressing type: bandage, pressure dressing and bacitracin (Mupirocin)    Skin repair Complexity:  Complex Final length (cm):  2 Reason for type of repair: reduce tension to allow closure, reduce the risk of dehiscence, infection, and necrosis, reduce subcutaneous dead space and avoid a hematoma, allow closure of the large defect, preserve normal  anatomy, preserve normal anatomical and functional relationships and enhance both functionality and cosmetic results   Undermining: area extensively undermined   Undermining comment:  Undermining Defect 1.2cm Subcutaneous layers (deep stitches):  Suture size:  3-0 Suture type: Vicryl (polyglactin 910)   Subcutaneous suture technique: Inverted Dermal. Fine/surface layer approximation (top stitches):  Suture size:  3-0 Suture type: nylon   Stitches: horizontal mattress   Suture removal (days):  7 Hemostasis achieved with: pressure Outcome: patient tolerated procedure well with no complications   Post-procedure details: sterile dressing applied and wound care instructions given   Dressing type: bandage, pressure dressing and bacitracin (Mupirocin)    mupirocin ointment (BACTROBAN) 2 % Apply 1 application topically daily. Qd to excision site  Specimen 1 - Surgical pathology Differential Diagnosis: D48.5 Cyst vs other  Check Margins: No Cystic pap 1.2cm  Cyst vs other  Start Mupirocin oint qd to excision site  Return in about 1 week (around 04/09/2021) for suture removal.  I, Othelia Pulling, RMA, am acting as scribe for Sarina Ser, MD . Documentation: I have reviewed the above documentation for accuracy and completeness, and I agree with the above.  Sarina Ser, MD

## 2021-04-02 NOTE — Patient Instructions (Signed)
Wound Care Instructions  Cleanse wound gently with soap and water once a day then pat dry with clean gauze. Apply a thing coat of Petrolatum (petroleum jelly, "Vaseline") over the wound (unless you have an allergy to this). We recommend that you use a new, sterile tube of Vaseline. Do not pick or remove scabs. Do not remove the yellow or white "healing tissue" from the base of the wound.  Cover the wound with fresh, clean, nonstick gauze and secure with paper tape. You may use Band-Aids in place of gauze and tape if the would is small enough, but would recommend trimming much of the tape off as there is often too much. Sometimes Band-Aids can irritate the skin.  You should call the office for your biopsy report after 1 week if you have not already been contacted.  If you experience any problems, such as abnormal amounts of bleeding, swelling, significant bruising, significant pain, or evidence of infection, please call the office immediately.  FOR ADULT SURGERY PATIENTS: If you need something for pain relief you may take 1 extra strength Tylenol (acetaminophen) AND 2 Ibuprofen (200mg each) together every 4 hours as needed for pain. (do not take these if you are allergic to them or if you have a reason you should not take them.) Typically, you may only need pain medication for 1 to 3 days.   If you have any questions or concerns for your doctor, please call our main line at [phone] and press option 4 to reach your doctor's medical assistant. If no one answers, please leave a voicemail as directed and we will return your call as soon as possible. Messages left after 4 pm will be answered the following business day.   You may also send us a message via MyChart. We typically respond to MyChart messages within 1-2 business days.  For prescription refills, please ask your pharmacy to contact our office. Our fax number is [phone].  If you have an urgent issue when the clinic is closed that  cannot wait until the next business day, you can page your doctor at the number below.    Please note that while we do our best to be available for urgent issues outside of office hours, we are not available 24/7.   If you have an urgent issue and are unable to reach us, you may choose to seek medical care at your doctor's office, retail clinic, urgent care center, or emergency room.  If you have a medical emergency, please immediately call 911 or go to the emergency department.  Pager Numbers  - Dr. Kowalski: [phone]  - Dr. Moye: [phone]  - Dr. Stewart: [phone]  In the event of inclement weather, please call our main line at [phone] for an update on the status of any delays or closures.  Dermatology Medication Tips: Please keep the boxes that topical medications come in in order to help keep track of the instructions about where and how to use these. Pharmacies typically print the medication instructions only on the boxes and not directly on the medication tubes.   If your medication is too expensive, please contact our office at [phone] option 4 or send us a message through MyChart.   We are unable to tell what your co-pay for medications will be in advance as this is different depending on your insurance coverage. However, we may be able to find a substitute medication at lower cost or fill out paperwork to get insurance to cover a needed   medication.   If a prior authorization is required to get your medication covered by your insurance company, please allow us 1-2 business days to complete this process.  Drug prices often vary depending on where the prescription is filled and some pharmacies may offer cheaper prices.  The website www.goodrx.com contains coupons for medications through different pharmacies. The prices here do not account for what the cost may be with help from insurance (it may be cheaper with your insurance), but the website can give you the  price if you did not use any insurance.  - You can print the associated coupon and take it with your prescription to the pharmacy.  - You may also stop by our office during regular business hours and pick up a GoodRx coupon card.  - If you need your prescription sent electronically to a different pharmacy, notify our office through [facility] MyChart or by phone at [phone] option 4.   

## 2021-04-04 ENCOUNTER — Ambulatory Visit: Payer: BC Managed Care – PPO | Admitting: Dermatology

## 2021-04-05 ENCOUNTER — Encounter: Payer: Self-pay | Admitting: Dermatology

## 2021-04-09 ENCOUNTER — Other Ambulatory Visit: Payer: Self-pay

## 2021-04-09 ENCOUNTER — Ambulatory Visit (INDEPENDENT_AMBULATORY_CARE_PROVIDER_SITE_OTHER): Payer: BC Managed Care – PPO | Admitting: Dermatology

## 2021-04-09 ENCOUNTER — Telehealth: Payer: Self-pay

## 2021-04-09 DIAGNOSIS — L7211 Pilar cyst: Secondary | ICD-10-CM

## 2021-04-09 DIAGNOSIS — Z4802 Encounter for removal of sutures: Secondary | ICD-10-CM

## 2021-04-09 NOTE — Patient Instructions (Signed)

## 2021-04-09 NOTE — Telephone Encounter (Signed)
Pt informed of results at suture removal appt

## 2021-04-09 NOTE — Telephone Encounter (Signed)
-----   Message from Ralene Bathe, MD sent at 04/03/2021  5:21 PM EDT ----- Diagnosis Skin (M), right flank PILAR CYST  Benign cyst

## 2021-04-09 NOTE — Progress Notes (Signed)
   Follow-Up Visit   Subjective  Timothy Mccullough is a 50 y.o. male who presents for the following: Suture / Staple Removal (7 day SR for in office excision of biopsy proven pilar cyst).  The following portions of the chart were reviewed this encounter and updated as appropriate:  Tobacco  Allergies  Meds  Problems  Med Hx  Surg Hx  Fam Hx     Review of Systems: No other skin or systemic complaints except as noted in HPI or Assessment and Plan.  Objective  Well appearing patient in no apparent distress; mood and affect are within normal limits.  A focused examination was performed including right flank. Relevant physical exam findings are noted in the Assessment and Plan.  Right Flank 7 day suture removal for biopsy proven pilar cyst  Incision site is clean, dry and intact   Assessment & Plan  Pilar cyst Right Flank  Encounter for Removal of Sutures - Incision site at the right flank is clean, dry and intact - Wound cleansed, sutures removed, wound cleansed and steri strips applied.  - Discussed pathology results showing pilar cyst  - Patient advised to keep steri-strips dry until they fall off. - Scars remodel for a full year. - Once steri-strips fall off, patient can apply over-the-counter silicone scar cream each night to help with scar remodeling if desired. - Patient advised to call with any concerns or if they notice any new or changing lesions.   Return in about 1 month (around 05/09/2021) for as scheduled.  IHarriett Sine, CMA, am acting as scribe for Sarina Ser, MD.  Documentation: I have reviewed the above documentation for accuracy and completeness, and I agree with the above.  Sarina Ser, MD

## 2021-04-12 ENCOUNTER — Encounter: Payer: Self-pay | Admitting: Dermatology

## 2021-05-09 ENCOUNTER — Ambulatory Visit: Payer: BC Managed Care – PPO | Admitting: Dermatology

## 2021-05-09 ENCOUNTER — Encounter: Payer: Self-pay | Admitting: Dermatology

## 2021-05-09 ENCOUNTER — Other Ambulatory Visit: Payer: Self-pay

## 2021-05-09 DIAGNOSIS — L578 Other skin changes due to chronic exposure to nonionizing radiation: Secondary | ICD-10-CM | POA: Diagnosis not present

## 2021-05-09 DIAGNOSIS — Z86006 Personal history of melanoma in-situ: Secondary | ICD-10-CM

## 2021-05-09 DIAGNOSIS — Z86018 Personal history of other benign neoplasm: Secondary | ICD-10-CM

## 2021-05-09 DIAGNOSIS — L821 Other seborrheic keratosis: Secondary | ICD-10-CM

## 2021-05-09 DIAGNOSIS — D1800 Hemangioma unspecified site: Secondary | ICD-10-CM

## 2021-05-09 DIAGNOSIS — Z1283 Encounter for screening for malignant neoplasm of skin: Secondary | ICD-10-CM | POA: Diagnosis not present

## 2021-05-09 DIAGNOSIS — L570 Actinic keratosis: Secondary | ICD-10-CM | POA: Diagnosis not present

## 2021-05-09 DIAGNOSIS — D229 Melanocytic nevi, unspecified: Secondary | ICD-10-CM | POA: Diagnosis not present

## 2021-05-09 DIAGNOSIS — L814 Other melanin hyperpigmentation: Secondary | ICD-10-CM

## 2021-05-09 NOTE — Progress Notes (Signed)
Follow-Up Visit   Subjective  Timothy Mccullough is a 50 y.o. male who presents for the following: Follow-up (Patient here today for tbse. He has a skin tag under right arm. ).  He has several areas to be evaluated today. Patient here for full body skin exam and skin cancer screening.  The following portions of the chart were reviewed this encounter and updated as appropriate:  Tobacco  Allergies  Meds  Problems  Med Hx  Surg Hx  Fam Hx     Review of Systems: No other skin or systemic complaints except as noted in HPI or Assessment and Plan.  Objective  Well appearing patient in no apparent distress; mood and affect are within normal limits.  A full examination was performed including scalp, head, eyes, ears, nose, lips, neck, chest, axillae, abdomen, back, buttocks, bilateral upper extremities, bilateral lower extremities, hands, feet, fingers, toes, fingernails, and toenails. All findings within normal limits unless otherwise noted below.  left nose x 1 Erythematous thin papules/macules with gritty scale.   Assessment & Plan  Actinic keratosis left nose x 1  Actinic keratoses are precancerous spots that appear secondary to cumulative UV radiation exposure/sun exposure over time. They are chronic with expected duration over 1 year. A portion of actinic keratoses will progress to squamous cell carcinoma of the skin. It is not possible to reliably predict which spots will progress to skin cancer and so treatment is recommended to prevent development of skin cancer.  Recommend daily broad spectrum sunscreen SPF 30+ to sun-exposed areas, reapply every 2 hours as needed.  Recommend staying in the shade or wearing long sleeves, sun glasses (UVA+UVB protection) and wide brim hats (4-inch brim around the entire circumference of the hat). Call for new or changing lesions.  Destruction of lesion - left nose x 1 Complexity: simple   Destruction method: cryotherapy   Informed consent:  discussed and consent obtained   Timeout:  patient name, date of birth, surgical site, and procedure verified Lesion destroyed using liquid nitrogen: Yes   Region frozen until ice ball extended beyond lesion: Yes   Outcome: patient tolerated procedure well with no complications   Post-procedure details: wound care instructions given    Skin cancer screening  Lentigines - Scattered tan macules - Due to sun exposure - Benign-appearing, observe - Recommend daily broad spectrum sunscreen SPF 30+ to sun-exposed areas, reapply every 2 hours as needed. - Call for any changes  Seborrheic Keratoses - Stuck-on, waxy, tan-brown papules and/or plaques  - Benign-appearing - Discussed benign etiology and prognosis. - Observe - Call for any changes  Melanocytic Nevi - Tan-brown and/or pink-flesh-colored symmetric macules and papules - Benign appearing on exam today - Observation - Call clinic for new or changing moles - Recommend daily use of broad spectrum spf 30+ sunscreen to sun-exposed areas.   Hemangiomas - Red papules - Discussed benign nature - Observe - Call for any changes  Actinic Damage - Chronic condition, secondary to cumulative UV/sun exposure - diffuse scaly erythematous macules with underlying dyspigmentation - Recommend daily broad spectrum sunscreen SPF 30+ to sun-exposed areas, reapply every 2 hours as needed.  - Staying in the shade or wearing long sleeves, sun glasses (UVA+UVB protection) and wide brim hats (4-inch brim around the entire circumference of the hat) are also recommended for sun protection.  - Call for new or changing lesions.  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  - Call if any new or changing lesions are noted between office visits  History of  Melanoma in Situ - No evidence of recurrence today right mid back at lateral side  - Recommend regular full body skin exams - Recommend daily broad spectrum sunscreen SPF 30+ to sun-exposed areas, reapply every 2 hours as needed.  - Call if any new or changing lesions are noted between office visits   Skin cancer screening performed today.  Return for 2 - 3  month tbse history of melanoma . IRuthell Rummage, CMA, am acting as scribe for Sarina Ser, MD. Documentation: I have reviewed the above documentation for accuracy and completeness, and I agree with the above.  Sarina Ser, MD

## 2021-05-09 NOTE — Patient Instructions (Signed)
Actinic keratoses are precancerous spots that appear secondary to cumulative UV radiation exposure/sun exposure over time. They are chronic with expected duration over 1 year. A portion of actinic keratoses will progress to squamous cell carcinoma of the skin. It is not possible to reliably predict which spots will progress to skin cancer and so treatment is recommended to prevent development of skin cancer.  Recommend daily broad spectrum sunscreen SPF 30+ to sun-exposed areas, reapply every 2 hours as needed.  Recommend staying in the shade or wearing long sleeves, sun glasses (UVA+UVB protection) and wide brim hats (4-inch brim around the entire circumference of the hat). Call for new or changing lesions.   Cryotherapy Aftercare  Wash gently with soap and water everyday.   Apply Vaseline and Band-Aid daily until healed.     Melanoma  Melanoma is the most dangerous type of skin cancer, and is the leading cause of death from skin disease.  You are more likely to develop melanoma if you: Have light-colored skin, light-colored eyes, or red or blond hair Spend a lot of time in the sun Tan regularly, either outdoors or in a tanning bed Have had blistering sunburns, especially during childhood Have a close family member who has had a melanoma Have atypical moles or large birthmarks  The first sign of melanoma is often a change in a mole or a new dark spot.  The ABCDE system is a way of remembering the signs of melanoma. A for asymmetry:  The two halves do not match. B for border:  The edges of the growth are irregular. C for color:  A mixture of colors are present instead of an even brown color. D for diameter:  Melanomas are usually (but not always) greater than 26mm - the size of a pencil eraser. E for evolution:  The spot keeps changing in size, shape, and color. If your doctor thinks that you might have a skin cancer, he or she will perform a biopsy.  A piece of skin is removed and sent  to a laboratory for examination under a microscope.  This is the best test for melanoma.  Melanoma is almost always treated with surgery.  The cancer and an area of skin surrounding it are removed.  For early melanomas, this is sometimes the only treatment that is necessary.  For some melanomas, a sentinel node biopsy will be performed.  When melanoma starts to spread to other parts of the body, it will usually go to a nearby lymph node first.  This is called the sentinel lymph node, and it can be found with a special x-ray test.  The surgeon can biopsy this lymph node to see if the cancer has already spread.  When melanoma has spread to other organs, treatment is much more difficult.  Metastatic melanoma is melanoma that is found elsewhere in the body.  It usually cannot be cured.  Treatment involves shrinking the cancer spots for comfort and for the longest possible survival time.  This can be done with chemotherapy, immune therapies, radiation, or additional surgery.  How well a patient does depends on many things, but the most important of these is how quickly the cancer was found.  If caught early, melanoma can be cured.  Doctors use the depth of the melanoma as a measure of how likely it is that the melanoma has spread.  Melanomas less than 66mm deep in the skin have very little chance of having spread, whereas melanomas deeper than 77mm are more  likely to have spread to other organs.  If you have had a melanoma and have been successfully treated, it is very important to examine your skin regularly for any changes.  Frequent skin exams by a doctor are recommended.  Often these are done every three months for the first two years after a melanoma, and every six to twelve months after that.  Your risk for melanoma is increased after you have had one.  Melanoma may return years later.  If you have any questions or concerns for your doctor, please call our main line at [phone] and press option 4 to  reach your doctor's medical assistant. If no one answers, please leave a voicemail as directed and we will return your call as soon as possible. Messages left after 4 pm will be answered the following business day.   You may also send [place] a message via [place]. We typically respond to MyChart messages within 1-2 business days.  For prescription refills, please ask your pharmacy to contact our office. Our fax number is [phone].  If you have an urgent issue when the clinic is closed that cannot wait until the next business day, you can page your doctor at the number below.    Please note that while we do our best to be available for urgent issues outside of office hours, we are not available 24/7.   If you have an urgent issue and are unable to reach [place], you may choose to seek medical care at your doctor's office, retail clinic, urgent care center, or emergency room.  If you have a medical emergency, please immediately call 911 or go to the emergency department.  Pager Numbers  - Dr. Nehemiah Massed: [phone]  - Dr. Laurence Ferrari: [phone]  - Dr. Nicole Kindred: [phone]  In the event of inclement weather, please call our main line at [phone] for an update on the status of any delays or closures.  Dermatology Medication Tips: Please keep the boxes that topical medications come in in order to help keep track of the instructions about where and how to use these. Pharmacies typically print the medication instructions only on the boxes and not directly on the medication tubes.   If your medication is too expensive, please contact our office at [phone] option 4 or send [place] a message through [place].   We are unable to tell what your co-pay for medications will be in advance as this is different depending on your insurance coverage. However, we may be able to find a substitute medication at lower cost or fill out paperwork to get insurance to cover a needed medication.   If a prior  authorization is required to get your medication covered by your insurance company, please allow [place] 1-2 business days to complete this process.  Drug prices often vary depending on where the prescription is filled and some pharmacies may offer cheaper prices.  The website www.goodrx.com contains coupons for medications through different pharmacies. The prices here do not account for what the cost may be with help from insurance (it may be cheaper with your insurance), but the website can give you the price if you did not use any insurance.  - You can print the associated coupon and take it with your prescription to the pharmacy.  - You may also stop by our office during regular business hours and pick up a GoodRx coupon card.  - If you need your prescription sent electronically to a different pharmacy, notify our office through [facility]  MyChart or by phone at [phone] option 4.

## 2021-08-08 ENCOUNTER — Ambulatory Visit: Payer: BC Managed Care – PPO | Admitting: Dermatology

## 2021-08-08 ENCOUNTER — Other Ambulatory Visit: Payer: Self-pay

## 2021-08-08 DIAGNOSIS — L821 Other seborrheic keratosis: Secondary | ICD-10-CM

## 2021-08-08 DIAGNOSIS — Z86006 Personal history of melanoma in-situ: Secondary | ICD-10-CM | POA: Diagnosis not present

## 2021-08-08 DIAGNOSIS — Z85828 Personal history of other malignant neoplasm of skin: Secondary | ICD-10-CM

## 2021-08-08 DIAGNOSIS — D225 Melanocytic nevi of trunk: Secondary | ICD-10-CM

## 2021-08-08 DIAGNOSIS — L578 Other skin changes due to chronic exposure to nonionizing radiation: Secondary | ICD-10-CM

## 2021-08-08 DIAGNOSIS — D229 Melanocytic nevi, unspecified: Secondary | ICD-10-CM

## 2021-08-08 DIAGNOSIS — Z1283 Encounter for screening for malignant neoplasm of skin: Secondary | ICD-10-CM | POA: Diagnosis not present

## 2021-08-08 DIAGNOSIS — D1800 Hemangioma unspecified site: Secondary | ICD-10-CM

## 2021-08-08 DIAGNOSIS — L814 Other melanin hyperpigmentation: Secondary | ICD-10-CM

## 2021-08-08 DIAGNOSIS — D492 Neoplasm of unspecified behavior of bone, soft tissue, and skin: Secondary | ICD-10-CM

## 2021-08-08 NOTE — Progress Notes (Signed)
Follow-Up Visit   Subjective  Timothy Mccullough is a 51 y.o. male who presents for the following: Annual Exam (Mole check ). 3 months f/u hx of Melanoma in situ right mid back lat near side 09/27/20 The patient presents for Total-Body Skin Exam (TBSE) for skin cancer screening and mole check.  The patient has spots, moles and lesions to be evaluated, some may be new or changing and the patient has concerns that these could be cancer.   The following portions of the chart were reviewed this encounter and updated as appropriate:   Tobacco   Allergies   Meds   Problems   Med Hx   Surg Hx   Fam Hx      Review of Systems:  No other skin or systemic complaints except as noted in HPI or Assessment and Plan.  Objective  Well appearing patient in no apparent distress; mood and affect are within normal limits.  A full examination was performed including scalp, head, eyes, ears, nose, lips, neck, chest, axillae, abdomen, back, buttocks, bilateral upper extremities, bilateral lower extremities, hands, feet, fingers, toes, fingernails, and toenails. All findings within normal limits unless otherwise noted below.  right lower quad abdomen 0.7 cm brown macule         Assessment & Plan  Neoplasm of skin right lower quad abdomen  Epidermal / dermal shaving  Lesion diameter (cm):  0.7 Informed consent: discussed and consent obtained   Timeout: patient name, date of birth, surgical site, and procedure verified   Procedure prep:  Patient was prepped and draped in usual sterile fashion Prep type:  Isopropyl alcohol Anesthesia: the lesion was anesthetized in a standard fashion   Anesthetic:  1% lidocaine w/ epinephrine 1-100,000 buffered w/ 8.4% NaHCO3 Hemostasis achieved with: pressure, aluminum chloride and electrodesiccation   Outcome: patient tolerated procedure well   Post-procedure details: sterile dressing applied and wound care instructions given   Dressing type: bandage and petrolatum     Specimen 1 - Surgical pathology Differential Diagnosis: R/O Dysplastic nevus   Check Margins: No  Related Medications mupirocin ointment (BACTROBAN) 2 % Apply 1 application topically daily. Qd to excision site  Skin cancer screening  Lentigines - Scattered tan macules - Due to sun exposure - Benign-appearing, observe - Recommend daily broad spectrum sunscreen SPF 30+ to sun-exposed areas, reapply every 2 hours as needed. - Call for any changes  Seborrheic Keratoses - Stuck-on, waxy, tan-brown papules and/or plaques  - Benign-appearing - Discussed benign etiology and prognosis. - Observe - Call for any changes  Melanocytic Nevi - Tan-brown and/or pink-flesh-colored symmetric macules and papules - Benign appearing on exam today - Observation - Call clinic for new or changing moles - Recommend daily use of broad spectrum spf 30+ sunscreen to sun-exposed areas.   Hemangiomas - Red papules - Discussed benign nature - Observe - Call for any changes  Actinic Damage - Chronic condition, secondary to cumulative UV/sun exposure - diffuse scaly erythematous macules with underlying dyspigmentation - Recommend daily broad spectrum sunscreen SPF 30+ to sun-exposed areas, reapply every 2 hours as needed.  - Staying in the shade or wearing long sleeves, sun glasses (UVA+UVB protection) and wide brim hats (4-inch brim around the entire circumference of the hat) are also recommended for sun protection.  - Call for new or changing lesions.  History of Basal Cell Carcinoma of the Skin Right alar crease 03/29/2020 - No evidence of recurrence today - Recommend regular full body skin exams - Recommend  daily broad spectrum sunscreen SPF 30+ to sun-exposed areas, reapply every 2 hours as needed.  - Call if any new or changing lesions are noted between office visits   History of Melanoma in Situ Right mid back lat near side 09/27/2020 - No evidence of recurrence today - Recommend  regular full body skin exams - Recommend daily broad spectrum sunscreen SPF 30+ to sun-exposed areas, reapply every 2 hours as needed.  - Call if any new or changing lesions are noted between office visits   Skin cancer screening performed today.   Return in about 3 months (around 11/06/2021) for TBSE, Hx of Melanoma .  IMarye Round, CMA, am acting as scribe for Sarina Ser, MD .  Documentation: I have reviewed the above documentation for accuracy and completeness, and I agree with the above.  Sarina Ser, MD

## 2021-08-08 NOTE — Patient Instructions (Addendum)

## 2021-08-12 ENCOUNTER — Telehealth: Payer: Self-pay

## 2021-08-12 NOTE — Telephone Encounter (Signed)
Advised pt of bx result.  Advised pt to keep f/u appt./sh

## 2021-08-12 NOTE — Telephone Encounter (Signed)
-----   Message from Ralene Bathe, MD sent at 08/12/2021  5:40 PM EST ----- Diagnosis Skin , right lower quad abdomen DYSPLASTIC COMPOUND NEVUS WITH MILD ATYPIA, DEEP MARGIN INVOLVED  Mild dysplastic Recheck next visit Keep follow up appt

## 2021-08-13 ENCOUNTER — Encounter: Payer: Self-pay | Admitting: Dermatology

## 2021-11-07 ENCOUNTER — Ambulatory Visit: Payer: BC Managed Care – PPO | Admitting: Dermatology

## 2021-11-07 DIAGNOSIS — L821 Other seborrheic keratosis: Secondary | ICD-10-CM

## 2021-11-07 DIAGNOSIS — L578 Other skin changes due to chronic exposure to nonionizing radiation: Secondary | ICD-10-CM

## 2021-11-07 DIAGNOSIS — Z86018 Personal history of other benign neoplasm: Secondary | ICD-10-CM

## 2021-11-07 DIAGNOSIS — L570 Actinic keratosis: Secondary | ICD-10-CM

## 2021-11-07 DIAGNOSIS — D1800 Hemangioma unspecified site: Secondary | ICD-10-CM

## 2021-11-07 DIAGNOSIS — Z85828 Personal history of other malignant neoplasm of skin: Secondary | ICD-10-CM | POA: Diagnosis not present

## 2021-11-07 DIAGNOSIS — L814 Other melanin hyperpigmentation: Secondary | ICD-10-CM

## 2021-11-07 DIAGNOSIS — Z1283 Encounter for screening for malignant neoplasm of skin: Secondary | ICD-10-CM | POA: Diagnosis not present

## 2021-11-07 DIAGNOSIS — Z86006 Personal history of melanoma in-situ: Secondary | ICD-10-CM

## 2021-11-07 DIAGNOSIS — D229 Melanocytic nevi, unspecified: Secondary | ICD-10-CM

## 2021-11-07 NOTE — Patient Instructions (Addendum)

## 2021-11-07 NOTE — Progress Notes (Signed)
? ?Follow-Up Visit ?  ?Subjective  ?Timothy Mccullough is a 51 y.o. male who presents for the following: Total body skin exam (Hx of Melanoma IS right mid back lat near side - Excised 10/23/2020, Hx of BCC R alar crease, Hx of Dysplastic Nevi). ?The patient presents for Total-Body Skin Exam (TBSE) for skin cancer screening and mole check.  The patient has spots, moles and lesions to be evaluated, some may be new or changing and the patient has concerns that these could be cancer.  ? ?The following portions of the chart were reviewed this encounter and updated as appropriate:  ? Tobacco  Allergies  Meds  Problems  Med Hx  Surg Hx  Fam Hx   ?  ?Review of Systems:  No other skin or systemic complaints except as noted in HPI or Assessment and Plan. ? ?Objective  ?Well appearing patient in no apparent distress; mood and affect are within normal limits. ? ?A full examination was performed including scalp, head, eyes, ears, nose, lips, neck, chest, axillae, abdomen, back, buttocks, bilateral upper extremities, bilateral lower extremities, hands, feet, fingers, toes, fingernails, and toenails. All findings within normal limits unless otherwise noted below. ? ?right mid back lat near side ?Well healed scar with no evidence of recurrence, no lymphadenopathy.  ? ?nose x 1, forehead x 1, L hand x 1, R ear x 1 (4) ?Pink scaly macules ? ? ?Assessment & Plan  ? ?Lentigines ?- Scattered tan macules ?- Due to sun exposure ?- Benign-appearing, observe ?- Recommend daily broad spectrum sunscreen SPF 30+ to sun-exposed areas, reapply every 2 hours as needed. ?- Call for any changes ? ?Seborrheic Keratoses ?- Stuck-on, waxy, tan-brown papules and/or plaques  ?- Benign-appearing ?- Discussed benign etiology and prognosis. ?- Observe ?- Call for any changes ? ?Melanocytic Nevi ?- Tan-brown and/or pink-flesh-colored symmetric macules and papules ?- Benign appearing on exam today ?- Observation ?- Call clinic for new or changing moles ?-  Recommend daily use of broad spectrum spf 30+ sunscreen to sun-exposed areas.  ? ?Hemangiomas ?- Red papules ?- Discussed benign nature ?- Observe ?- Call for any changes ? ?Actinic Damage ?- Chronic condition, secondary to cumulative UV/sun exposure ?- diffuse scaly erythematous macules with underlying dyspigmentation ?- Recommend daily broad spectrum sunscreen SPF 30+ to sun-exposed areas, reapply every 2 hours as needed.  ?- Staying in the shade or wearing long sleeves, sun glasses (UVA+UVB protection) and wide brim hats (4-inch brim around the entire circumference of the hat) are also recommended for sun protection.  ?- Call for new or changing lesions. ? ?Skin cancer screening performed today. ? ?History of Basal Cell Carcinoma of the Skin ?- No evidence of recurrence today ?- Recommend regular full body skin exams ?- Recommend daily broad spectrum sunscreen SPF 30+ to sun-exposed areas, reapply every 2 hours as needed.  ?- Call if any new or changing lesions are noted between office visits  ?- R alar crease ? ?History of Dysplastic Nevi ?- No evidence of recurrence today ?- Recommend regular full body skin exams ?- Recommend daily broad spectrum sunscreen SPF 30+ to sun-exposed areas, reapply every 2 hours as needed.  ?- Call if any new or changing lesions are noted between office visits  ?- multiple ? ?History of melanoma in situ ?right mid back lat near side ?Excised 10/23/2020 ?Clear.  No lymphadenopathy.  Observe for recurrence. Call clinic for new or changing lesions.  Recommend regular skin exams, daily broad-spectrum spf 30+ sunscreen use, and  photoprotection.   ? ?Patient prefers to continue with 25mTBSE f/u ? ?AK (actinic keratosis) (4) ?nose x 1, forehead x 1, L hand x 1, R ear x 1 ? ?Destruction of lesion - nose x 1, forehead x 1, L hand x 1, R ear x 1 ?Complexity: simple   ?Destruction method: cryotherapy   ?Informed consent: discussed and consent obtained   ?Timeout:  patient name, date of birth,  surgical site, and procedure verified ?Lesion destroyed using liquid nitrogen: Yes   ?Region frozen until ice ball extended beyond lesion: Yes   ?Outcome: patient tolerated procedure well with no complications   ?Post-procedure details: wound care instructions given   ? ?Return in about 3 months (around 02/06/2022) for TBSE, Hx of Melanoma IS, Hx of BCC, Hx of Dysplastic nevi, Hx of AKs. ? ?I, SOthelia Pulling RMA, am acting as scribe for DSarina Ser MD . ?Documentation: I have reviewed the above documentation for accuracy and completeness, and I agree with the above. ? ?DSarina Ser MD ? ? ? ?

## 2021-11-16 ENCOUNTER — Encounter: Payer: Self-pay | Admitting: Dermatology

## 2022-02-10 ENCOUNTER — Ambulatory Visit: Payer: BC Managed Care – PPO | Admitting: Dermatology

## 2023-05-18 ENCOUNTER — Ambulatory Visit: Payer: BC Managed Care – PPO | Admitting: Dermatology

## 2023-05-18 ENCOUNTER — Encounter: Payer: Self-pay | Admitting: Dermatology

## 2023-05-18 DIAGNOSIS — L821 Other seborrheic keratosis: Secondary | ICD-10-CM

## 2023-05-18 DIAGNOSIS — D1801 Hemangioma of skin and subcutaneous tissue: Secondary | ICD-10-CM

## 2023-05-18 DIAGNOSIS — L578 Other skin changes due to chronic exposure to nonionizing radiation: Secondary | ICD-10-CM

## 2023-05-18 DIAGNOSIS — W908XXA Exposure to other nonionizing radiation, initial encounter: Secondary | ICD-10-CM

## 2023-05-18 DIAGNOSIS — D2339 Other benign neoplasm of skin of other parts of face: Secondary | ICD-10-CM

## 2023-05-18 DIAGNOSIS — D229 Melanocytic nevi, unspecified: Secondary | ICD-10-CM

## 2023-05-18 DIAGNOSIS — L570 Actinic keratosis: Secondary | ICD-10-CM

## 2023-05-18 DIAGNOSIS — Z86018 Personal history of other benign neoplasm: Secondary | ICD-10-CM

## 2023-05-18 DIAGNOSIS — E669 Obesity, unspecified: Secondary | ICD-10-CM | POA: Insufficient documentation

## 2023-05-18 DIAGNOSIS — N200 Calculus of kidney: Secondary | ICD-10-CM | POA: Insufficient documentation

## 2023-05-18 DIAGNOSIS — Z86006 Personal history of melanoma in-situ: Secondary | ICD-10-CM

## 2023-05-18 DIAGNOSIS — Z85828 Personal history of other malignant neoplasm of skin: Secondary | ICD-10-CM

## 2023-05-18 DIAGNOSIS — L814 Other melanin hyperpigmentation: Secondary | ICD-10-CM | POA: Diagnosis not present

## 2023-05-18 DIAGNOSIS — D492 Neoplasm of unspecified behavior of bone, soft tissue, and skin: Secondary | ICD-10-CM

## 2023-05-18 DIAGNOSIS — Z1283 Encounter for screening for malignant neoplasm of skin: Secondary | ICD-10-CM | POA: Diagnosis not present

## 2023-05-18 DIAGNOSIS — S60011A Contusion of right thumb without damage to nail, initial encounter: Secondary | ICD-10-CM

## 2023-05-18 NOTE — Progress Notes (Signed)
Follow-Up Visit   Subjective  Timothy Mccullough is a 52 y.o. male who presents for the following: Skin Cancer Screening and Full Body Skin Exam. Hx of MIS. HxBCC. Hx of dysplastic nevi.   Spot on face. Dur: 1-2 months. Dry scaly spot.  The patient presents for Total-Body Skin Exam (TBSE) for skin cancer screening and mole check. The patient has spots, moles and lesions to be evaluated, some may be new or changing and the patient may have concern these could be cancer.    The following portions of the chart were reviewed this encounter and updated as appropriate: medications, allergies, medical history  Review of Systems:  No other skin or systemic complaints except as noted in HPI or Assessment and Plan.  Objective  Well appearing patient in no apparent distress; mood and affect are within normal limits.  A full examination was performed including scalp, head, eyes, ears, nose, lips, neck, chest, axillae, abdomen, back, buttocks, bilateral upper extremities, bilateral lower extremities, hands, feet, fingers, toes, fingernails, and toenails. All findings within normal limits unless otherwise noted below.   Relevant physical exam findings are noted in the Assessment and Plan.  Left Temple x2 (2) Erythematous thin papules/macules with gritty scale.      Right Temple 5 mm indurated pink papule          Assessment & Plan    HISTORY OF MELANOMA IN SITU. Right mid back lateral near side. Excised 10/23/2020. - No evidence of recurrence today - Recommend regular full body skin exams - Recommend daily broad spectrum sunscreen SPF 30+ to sun-exposed areas, reapply every 2 hours as needed.  - Call if any new or changing lesions are noted between office visits   HISTORY OF BASAL CELL CARCINOMA OF THE SKIN. Right alar crease. 03/29/2020.  - No evidence of recurrence today - Recommend regular full body skin exams - Recommend daily broad spectrum sunscreen SPF 30+ to sun-exposed areas,  reapply every 2 hours as needed.  - Call if any new or changing lesions are noted between office visits    HISTORY OF DYSPLASTIC NEVUS. Multiple sites, see history. No evidence of recurrence today Recommend regular full body skin exams Recommend daily broad spectrum sunscreen SPF 30+ to sun-exposed areas, reapply every 2 hours as needed.  Call if any new or changing lesions are noted between office visits    SKIN CANCER SCREENING PERFORMED TODAY.  ACTINIC DAMAGE - Chronic condition, secondary to cumulative UV/sun exposure - diffuse scaly erythematous macules with underlying dyspigmentation - Recommend daily broad spectrum sunscreen SPF 30+ to sun-exposed areas, reapply every 2 hours as needed.  - Staying in the shade or wearing long sleeves, sun glasses (UVA+UVB protection) and wide brim hats (4-inch brim around the entire circumference of the hat) are also recommended for sun protection.  - Call for new or changing lesions.  LENTIGINES, SEBORRHEIC KERATOSES, HEMANGIOMAS - Benign normal skin lesions - Benign-appearing - Call for any changes  MELANOCYTIC NEVI - Tan-brown and/or pink-flesh-colored symmetric macules and papules - Benign appearing on exam today - Observation - Call clinic for new or changing moles - Recommend daily use of broad spectrum spf 30+ sunscreen to sun-exposed areas.    Hematoma, secondary to trauma  Right thumb. Violaceous, dark macule.  RTC if not resolved in 1-2 months.   AK (actinic keratosis) (2) Left Temple x2  Actinic keratoses are precancerous spots that appear secondary to cumulative UV radiation exposure/sun exposure over time. They are chronic with expected  duration over 1 year. A portion of actinic keratoses will progress to squamous cell carcinoma of the skin. It is not possible to reliably predict which spots will progress to skin cancer and so treatment is recommended to prevent development of skin cancer.  Recommend daily broad  spectrum sunscreen SPF 30+ to sun-exposed areas, reapply every 2 hours as needed.  Recommend staying in the shade or wearing long sleeves, sun glasses (UVA+UVB protection) and wide brim hats (4-inch brim around the entire circumference of the hat). Call for new or changing lesions.  Destruction of lesion - Left Temple x2 (2) Complexity: simple   Destruction method: cryotherapy   Informed consent: discussed and consent obtained   Timeout:  patient name, date of birth, surgical site, and procedure verified Lesion destroyed using liquid nitrogen: Yes   Region frozen until ice ball extended beyond lesion: Yes   Cryo cycles: 1 or 2. Outcome: patient tolerated procedure well with no complications   Post-procedure details: wound care instructions given   Additional details:  Prior to procedure, discussed risks of blister formation, small wound, skin dyspigmentation, or rare scar following cryotherapy. Recommend Vaseline ointment to treated areas while healing.   Neoplasm of skin Right Temple  Skin / nail biopsy Type of biopsy: tangential   Informed consent: discussed and consent obtained   Timeout: patient name, date of birth, surgical site, and procedure verified   Procedure prep:  Patient was prepped and draped in usual sterile fashion Prep type:  Isopropyl alcohol Anesthesia: the lesion was anesthetized in a standard fashion   Anesthetic:  1% lidocaine w/ epinephrine 1-100,000 buffered w/ 8.4% NaHCO3 Instrument used: DermaBlade   Hemostasis achieved with: pressure and aluminum chloride   Outcome: patient tolerated procedure well   Post-procedure details: sterile dressing applied and wound care instructions given   Dressing type: bandage and petrolatum    Specimen 1 - Surgical pathology Differential Diagnosis: R/O SCC  Check Margins: No  Multiple benign nevi  Lentigines  Actinic elastosis  Cherry angioma  Seborrheic keratoses   Return in about 6 months (around 11/16/2023)  for TBSE, HxMIS, HxBCC, HxDN.  I, Lawson Radar, CMA, am acting as scribe for Elie Goody, MD.   Documentation: I have reviewed the above documentation for accuracy and completeness, and I agree with the above.  Elie Goody, MD

## 2023-05-18 NOTE — Patient Instructions (Signed)
 Cryotherapy Aftercare  Wash gently with soap and water everyday.   Apply Vaseline Jelly daily until healed.    Wound Care Instructions  Cleanse wound gently with soap and water once a day then pat dry with clean gauze. Apply a thin coat of Petrolatum (petroleum jelly, "Vaseline") over the wound (unless you have an allergy to this). We recommend that you use a new, sterile tube of Vaseline. Do not pick or remove scabs. Do not remove the yellow or white "healing tissue" from the base of the wound.  Cover the wound with fresh, clean, nonstick gauze and secure with paper tape. You may use Band-Aids in place of gauze and tape if the wound is small enough, but would recommend trimming much of the tape off as there is often too much. Sometimes Band-Aids can irritate the skin.  You should call the office for your biopsy report after 1 week if you have not already been contacted.  If you experience any problems, such as abnormal amounts of bleeding, swelling, significant bruising, significant pain, or evidence of infection, please call the office immediately.  FOR ADULT SURGERY PATIENTS: If you need something for pain relief you may take 1 extra strength Tylenol (acetaminophen) AND 2 Ibuprofen (200mg  each) together every 4 hours as needed for pain. (do not take these if you are allergic to them or if you have a reason you should not take them.) Typically, you may only need pain medication for 1 to 3 days.      Recommend daily broad spectrum sunscreen SPF 30+ to sun-exposed areas, reapply every 2 hours as needed. Call for new or changing lesions.  Staying in the shade or wearing long sleeves, sun glasses (UVA+UVB protection) and wide brim hats (4-inch brim around the entire circumference of the hat) are also recommended for sun protection.    Melanoma ABCDEs  Melanoma is the most dangerous type of skin cancer, and is the leading cause of death from skin disease.  You are more likely to develop  melanoma if you: Have light-colored skin, light-colored eyes, or red or blond hair Spend a lot of time in the sun Tan regularly, either outdoors or in a tanning bed Have had blistering sunburns, especially during childhood Have a close family member who has had a melanoma Have atypical moles or large birthmarks  Early detection of melanoma is key since treatment is typically straightforward and cure rates are extremely high if we catch it early.   The first sign of melanoma is often a change in a mole or a new dark spot.  The ABCDE system is a way of remembering the signs of melanoma.  A for asymmetry:  The two halves do not match. B for border:  The edges of the growth are irregular. C for color:  A mixture of colors are present instead of an even brown color. D for diameter:  Melanomas are usually (but not always) greater than 6mm - the size of a pencil eraser. E for evolution:  The spot keeps changing in size, shape, and color.  Please check your skin once per month between visits. You can use a small mirror in front and a large mirror behind you to keep an eye on the back side or your body.   If you see any new or changing lesions before your next follow-up, please call to schedule a visit.  Please continue daily skin protection including broad spectrum sunscreen SPF 30+ to sun-exposed areas, reapplying every 2 hours  as needed when you're outdoors.   Staying in the shade or wearing long sleeves, sun glasses (UVA+UVB protection) and wide brim hats (4-inch brim around the entire circumference of the hat) are also recommended for sun protection.    Due to recent changes in healthcare laws, you may see results of your pathology and/or laboratory studies on MyChart before the doctors have had a chance to review them. We understand that in some cases there may be results that are confusing or concerning to you. Please understand that not all results are received at the same time and often the  doctors may need to interpret multiple results in order to provide you with the best plan of care or course of treatment. Therefore, we ask that you please give [place] 2 business days to thoroughly review all your results before contacting the office for clarification. Should we see a critical lab result, you will be contacted sooner.   If You Need Anything After Your Visit  If you have any questions or concerns for your doctor, please call our main line at [phone] and press option 4 to reach your doctor's medical assistant. If no one answers, please leave a voicemail as directed and we will return your call as soon as possible. Messages left after 4 pm will be answered the following business day.   You may also send [place] a message via MyChart. We typically respond to MyChart messages within 1-2 business days.  For prescription refills, please ask your pharmacy to contact our office. Our fax number is [phone].  If you have an urgent issue when the clinic is closed that cannot wait until the next business day, you can page your doctor at the number below.    Please note that while we do our best to be available for urgent issues outside of office hours, we are not available 24/7.   If you have an urgent issue and are unable to reach [place], you may choose to seek medical care at your doctor's office, retail clinic, urgent care center, or emergency room.  If you have a medical emergency, please immediately call 911 or go to the emergency department.  Pager Numbers  - Dr. Gwen Pounds: [phone]  - Dr. Roseanne Reno: [phone]  - Dr. Katrinka Blazing: [phone]   In the event of inclement weather, please call our main line at [phone] for an update on the status of any delays or closures.  Dermatology Medication Tips: Please keep the boxes that topical medications come in in order to help keep track of the instructions about where and how to use these. Pharmacies typically print the medication  instructions only on the boxes and not directly on the medication tubes.   If your medication is too expensive, please contact our office at [phone] option 4 or send [place] a message through MyChart.   We are unable to tell what your co-pay for medications will be in advance as this is different depending on your insurance coverage. However, we may be able to find a substitute medication at lower cost or fill out paperwork to get insurance to cover a needed medication.   If a prior authorization is required to get your medication covered by your insurance company, please allow [place] 1-2 business days to complete this process.  Drug prices often vary depending on where the prescription is filled and some pharmacies may offer cheaper prices.  The website www.goodrx.com contains coupons for medications through different pharmacies. The prices here do not account for  what the cost may be with help from insurance (it may be cheaper with your insurance), but the website can give you the price if you did not use any insurance.  - You can print the associated coupon and take it with your prescription to the pharmacy.  - You may also stop by our office during regular business hours and pick up a GoodRx coupon card.  - If you need your prescription sent electronically to a different pharmacy, notify our office through [facility] or by phone at [phone] option 4.     Si Usted Necesita Algo Despus de Su Visita  Tambin puede enviarnos un mensaje a travs de Clinical cytogeneticist. Por lo general respondemos a los mensajes de MyChart en el transcurso de 1 a 2 das hbiles.  Para renovar recetas, por favor pida a su farmacia que se ponga en contacto con nuestra oficina. Annie Sable de fax es [place] [phone].  Si tiene un asunto urgente cuando la clnica est cerrada y que no puede esperar hasta el siguiente da hbil, puede llamar/localizar a su doctor(a) al nmero que aparece a continuacin.   Por  favor, tenga en cuenta que aunque hacemos todo lo posible para estar disponibles para asuntos urgentes fuera del horario de [place], no estamos disponibles las 24 horas del da, los 7 [address]  [address] de la [place].   Si tiene un problema urgente y no puede comunicarse con nosotros, puede optar por buscar atencin mdica  en el consultorio de su doctor(a), en una clnica privada, en un centro de atencin urgente o en una sala de emergencias.  Si tiene Engineer, drilling, por favor llame inmediatamente al 911 o vaya a la sala de emergencias.  Nmeros de bper  - Dr. Gwen Pounds: [phone]  - Dra. Roseanne Reno: [phone]  - Dr. Katrinka Blazing: [phone]   En caso de inclemencias del tiempo, por favor llame a Lacy Duverney principal al [phone] para una actualizacin sobre el [place] de cualquier retraso o cierre.  Consejos para la medicacin en dermatologa: Por favor, guarde las cajas en las que vienen los medicamentos de uso tpico para ayudarle a seguir las instrucciones sobre dnde y cmo usarlos. Las farmacias generalmente imprimen las instrucciones del medicamento slo en las cajas y no directamente en los tubos del [place].   Si su medicamento es muy caro, por favor, pngase en contacto con Rolm Gala llamando al [phone] y presione la opcin 4 o envenos un mensaje a travs de Clinical cytogeneticist.   No podemos decirle cul ser su copago por los medicamentos por adelantado ya que esto es diferente dependiendo de la cobertura de su seguro. Sin embargo, es posible que podamos encontrar un medicamento sustituto a Audiological scientist un formulario para que el seguro cubra el medicamento que se considera necesario.   Si se requiere una autorizacin previa para que su compaa de seguros [place] su medicamento, por favor permtanos de 1 a 2 das hbiles para completar [address]  Los precios de los medicamentos varan con frecuencia dependiendo del Environmental consultant de dnde se surte la receta y alguna farmacias  pueden ofrecer precios ms baratos.  El sitio web www.goodrx.com tiene cupones para medicamentos de Health and safety inspector. Los precios aqu no tienen en cuenta lo que podra costar con la ayuda del seguro (puede ser ms barato con su seguro), pero el sitio web puede darle el precio si no utiliz Tourist information centre manager.  - Puede imprimir el cupn correspondiente y llevarlo con su receta a la farmacia.  Laroy Apple  puede pasar por nuestra oficina durante el horario de atencin regular y Education officer, museum una tarjeta de cupones de GoodRx.  - Si necesita que su receta se enve electrnicamente a una farmacia diferente, informe a nuestra oficina a travs de MyChart de [place] o por telfono llamando al [phone] y presione la opcin 4.

## 2023-05-18 NOTE — Progress Notes (Deleted)
   Follow-Up Visit   Subjective  Timothy Mccullough is a 52 y.o. male who presents for the following: Spot on face. Dur: 1-2 months. Dry scaly spot. HxBCC, HxMIS.  The patient has spots, moles and lesions to be evaluated, some may be new or changing and the patient may have concern these could be cancer.     The following portions of the chart were reviewed this encounter and updated as appropriate: medications, allergies, medical history  Review of Systems:  No other skin or systemic complaints except as noted in HPI or Assessment and Plan.  Objective  Well appearing patient in no apparent distress; mood and affect are within normal limits.  A focused examination was performed of the following areas: *** Relevant physical exam findings are noted in the Assessment and Plan.    Assessment & Plan      No follow-ups on file.  I, Lawson Radar, CMA, am acting as scribe for Elie Goody, MD.   Documentation: I have reviewed the above documentation for accuracy and completeness, and I agree with the above.  Elie Goody, MD

## 2023-05-21 LAB — SURGICAL PATHOLOGY

## 2023-05-25 ENCOUNTER — Telehealth: Payer: Self-pay

## 2023-05-25 NOTE — Telephone Encounter (Signed)
Patient advised pathology showed benign hair follicle overgrowth, no treatment needed. Butch Penny., RMA

## 2023-05-25 NOTE — Telephone Encounter (Signed)
-----   Message from [place] sent at 05/24/2023  8:35 AM EST ----- Diagnosis: Skin, right temple :       SURFACE OF TRICHILEMMOMA    Plan: please call with diagnosis. 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 It does not need to be treated or removed. If you develop many similar spots, please return for an appointment.

## 2023-06-17 ENCOUNTER — Encounter: Payer: Self-pay | Admitting: Otolaryngology

## 2023-06-22 ENCOUNTER — Encounter: Payer: Self-pay | Admitting: Otolaryngology

## 2023-06-22 ENCOUNTER — Other Ambulatory Visit: Payer: Self-pay | Admitting: Otolaryngology

## 2023-06-22 DIAGNOSIS — R1310 Dysphagia, unspecified: Secondary | ICD-10-CM

## 2023-06-22 DIAGNOSIS — K2100 Gastro-esophageal reflux disease with esophagitis, without bleeding: Secondary | ICD-10-CM

## 2023-06-25 ENCOUNTER — Ambulatory Visit
Admission: RE | Admit: 2023-06-25 | Discharge: 2023-06-25 | Disposition: A | Discharge status: Home / Self Care | Payer: BC Managed Care – PPO | Source: Ambulatory Visit | Attending: Otolaryngology | Admitting: Otolaryngology

## 2023-06-25 DIAGNOSIS — K2100 Gastro-esophageal reflux disease with esophagitis, without bleeding: Secondary | ICD-10-CM

## 2023-06-25 DIAGNOSIS — R1310 Dysphagia, unspecified: Secondary | ICD-10-CM

## 2023-06-26 ENCOUNTER — Encounter: Payer: Self-pay | Admitting: Gastroenterology

## 2023-06-26 ENCOUNTER — Other Ambulatory Visit: Payer: Self-pay

## 2023-06-26 ENCOUNTER — Other Ambulatory Visit: Payer: BC Managed Care – PPO

## 2023-06-26 DIAGNOSIS — R1310 Dysphagia, unspecified: Secondary | ICD-10-CM

## 2023-06-29 ENCOUNTER — Encounter: Payer: Self-pay | Admitting: Gastroenterology

## 2023-06-29 ENCOUNTER — Encounter
Admission: RE | Disposition: A | Discharge status: Home / Self Care | Payer: Self-pay | Source: Home / Self Care | Attending: Gastroenterology

## 2023-06-29 ENCOUNTER — Other Ambulatory Visit: Payer: Self-pay

## 2023-06-29 ENCOUNTER — Ambulatory Visit
Admission: RE | Admit: 2023-06-29 | Discharge: 2023-06-29 | Disposition: A | Discharge status: Home / Self Care | Payer: BC Managed Care – PPO | Attending: Gastroenterology | Admitting: Gastroenterology

## 2023-06-29 ENCOUNTER — Ambulatory Visit: Payer: Self-pay | Admitting: Anesthesiology

## 2023-06-29 ENCOUNTER — Ambulatory Visit: Payer: BC Managed Care – PPO | Admitting: Anesthesiology

## 2023-06-29 DIAGNOSIS — R1310 Dysphagia, unspecified: Secondary | ICD-10-CM

## 2023-06-29 DIAGNOSIS — K2289 Other specified disease of esophagus: Secondary | ICD-10-CM

## 2023-06-29 DIAGNOSIS — I10 Essential (primary) hypertension: Secondary | ICD-10-CM | POA: Diagnosis not present

## 2023-06-29 DIAGNOSIS — K219 Gastro-esophageal reflux disease without esophagitis: Secondary | ICD-10-CM | POA: Diagnosis not present

## 2023-06-29 HISTORY — PX: ESOPHAGOGASTRODUODENOSCOPY (EGD) WITH PROPOFOL: SHX5813

## 2023-06-29 HISTORY — DX: Essential (primary) hypertension: I10

## 2023-06-29 HISTORY — DX: Gastro-esophageal reflux disease without esophagitis: K21.9

## 2023-06-29 HISTORY — PX: ESOPHAGEAL DILATION: SHX303

## 2023-06-29 SURGERY — ESOPHAGOGASTRODUODENOSCOPY (EGD) WITH PROPOFOL
Anesthesia: General | Site: Throat

## 2023-06-29 MED ORDER — PROPOFOL 10 MG/ML IV BOLUS
INTRAVENOUS | Status: AC
  Filled 2023-06-29: qty 20

## 2023-06-29 MED ORDER — SODIUM CHLORIDE 0.9 % IV SOLN: INTRAVENOUS | Status: DC

## 2023-06-29 MED ORDER — LIDOCAINE HCL (CARDIAC) PF 100 MG/5ML IV SOSY
PREFILLED_SYRINGE | INTRAVENOUS | Status: DC | PRN
  Administered 2023-06-29: 50 mg via INTRAVENOUS

## 2023-06-29 MED ORDER — STERILE WATER FOR IRRIGATION IR SOLN
Status: DC | PRN
  Administered 2023-06-29: 50 mL

## 2023-06-29 MED ORDER — LACTATED RINGERS IV SOLN: INTRAVENOUS | Status: DC | PRN

## 2023-06-29 MED ORDER — LIDOCAINE HCL (PF) 2 % IJ SOLN
INTRAMUSCULAR | Status: AC
  Filled 2023-06-29: qty 5

## 2023-06-29 MED ORDER — PROPOFOL 10 MG/ML IV BOLUS
INTRAVENOUS | Status: DC | PRN
  Administered 2023-06-29: 90 mg via INTRAVENOUS
  Administered 2023-06-29: 50 mg via INTRAVENOUS

## 2023-06-29 MED ORDER — LACTATED RINGERS IV SOLN: Freq: Once | INTRAVENOUS | Status: AC

## 2023-06-29 SURGICAL SUPPLY — 9 items
BALLN DILATOR 15-18 8 (BALLOONS) ×2
BALLOON DILATOR 15-18 8 (BALLOONS) IMPLANT
BLOCK BITE 60FR ADLT L/F GRN (MISCELLANEOUS) ×2 IMPLANT
FORCEPS BIOP RAD 4 LRG CAP 4 (CUTTING FORCEPS) IMPLANT
GOWN CVR UNV OPN BCK APRN NK (MISCELLANEOUS) ×4 IMPLANT
KIT PRC NS LF DISP ENDO (KITS) ×2 IMPLANT
MANIFOLD NEPTUNE II (INSTRUMENTS) ×2 IMPLANT
SYR INFLATION 60ML (SYRINGE) IMPLANT
WATER STERILE IRR 250ML POUR (IV SOLUTION) ×2 IMPLANT

## 2023-06-29 NOTE — Anesthesia Preprocedure Evaluation (Signed)
Anesthesia Evaluation    Airway Mallampati: II  TM Distance: >3 FB Neck ROM: Full    Dental no notable dental hx.    Pulmonary    Pulmonary exam normal breath sounds clear to auscultation       Cardiovascular hypertension, Normal cardiovascular exam Rhythm:Regular Rate:Normal     Neuro/Psych    GI/Hepatic ,GERD  ,,  Endo/Other    Renal/GU Renal disease     Musculoskeletal   Abdominal   Peds  Hematology   Anesthesia Other Findings typical mole Atypical mole Atypical mole Atypical mole Basal cell carcinoma Melanoma in situ (HCC) Dysplastic nevus Dysplastic nevus GERD (gastroesophageal reflux disease) Hypertension Dysphagia    Reproductive/Obstetrics                              Anesthesia Physical Anesthesia Plan  ASA: 3  Anesthesia Plan: General   Post-op Pain Management:    Induction: Intravenous  PONV Risk Score and Plan:   Airway Management Planned: Natural Airway and Nasal Cannula  Additional Equipment:   Intra-op Plan:   Post-operative Plan:   Informed Consent: I have reviewed the patients History and Physical, chart, labs and discussed the procedure including the risks, benefits and alternatives for the proposed anesthesia with the patient or authorized representative who has indicated his/her understanding and acceptance.     Dental Advisory Given  Plan Discussed with: Anesthesiologist, CRNA and Surgeon  Anesthesia Plan Comments: (Patient consented for risks of anesthesia including but not limited to:  - adverse reactions to medications - risk of airway placement if required - damage to eyes, teeth, lips or other oral mucosa - nerve damage due to positioning  - sore throat or hoarseness - Damage to heart, brain, nerves, lungs, other parts of body or loss of life  Patient voiced understanding and assent.)         Anesthesia Quick Evaluation

## 2023-06-29 NOTE — Anesthesia Postprocedure Evaluation (Signed)
Anesthesia Post Note  Patient: Krosby [place]  Procedure(s) Performed: ESOPHAGOGASTRODUODENOSCOPY (EGD) WITH PROPOFOL (Throat) ESOPHAGEAL DILATION (Throat)  Patient location during evaluation: PACU Anesthesia Type: General Level of consciousness: awake and alert Pain management: pain level controlled Vital Signs Assessment: post-procedure vital signs reviewed and stable Respiratory status: spontaneous breathing, nonlabored ventilation, respiratory function stable and patient connected to nasal cannula oxygen Cardiovascular status: blood pressure returned to baseline and stable Postop Assessment: no apparent nausea or vomiting Anesthetic complications: no   No notable events documented.   Last Vitals:  Vitals:   06/29/23 0855 06/29/23 0900  BP:    Pulse: 100 86  Resp: (!) 22 (!) 25  Temp:    SpO2: 98% 94%    Last Pain:  Vitals:   06/29/23 0855  TempSrc:   PainSc: 0-No pain                 Jerl Munyan C Lalani Winkles

## 2023-06-29 NOTE — Op Note (Signed)
[facility] Gastroenterology Patient Name: Timothy Mccullough Procedure Date: 06/29/2023 8:31 AM MRN: [id number] Account #: [id number] Date of Birth: [birth year] Admit Type: Outpatient Age: 52 Room: [facility] OR ROOM 01 Gender: Male Note Status: Finalized Instrument Name: [id number] Procedure:             Upper GI endoscopy Indications:           Dysphagia Providers:             Midge Minium MD, MD Referring MD:          Myrle Sheng. Katrinka Blazing, MD (Referring MD) Medicines:             Propofol per Anesthesia Complications:         No immediate complications. Procedure:             Pre-Anesthesia Assessment:                        - Prior to the procedure, a History and Physical was                         performed, and patient medications and allergies were                         reviewed. The patient's tolerance of previous                         anesthesia was also reviewed. The risks and benefits                         of the procedure and the sedation options and risks                         were discussed with the patient. All questions were                         answered, and informed consent was obtained. Prior                         Anticoagulants: The patient has taken no anticoagulant                         or antiplatelet agents. ASA Grade Assessment: II - A                         patient with mild systemic disease. After reviewing                         the risks and benefits, the patient was deemed in                         satisfactory condition to undergo the procedure.                        After obtaining informed consent, the endoscope was                         passed under direct vision. Throughout the procedure,  the patient's blood pressure, pulse, and oxygen                         saturations were monitored continuously. The Endoscope                         was introduced through the mouth, and advanced to the                          second part of duodenum. The upper GI endoscopy was                         accomplished without difficulty. The patient tolerated                         the procedure well. Findings:      The examined esophagus was normal. Two biopsies were obtained in the       middle third of the esophagus with cold forceps for histology. A TTS       dilator was passed through the scope. Dilation with a 15-16.5-18 mm       balloon dilator was performed to 18 mm. The dilation site was examined       following endoscope reinsertion and showed complete resolution of       luminal narrowing.      The stomach was normal.      The examined duodenum was normal. Impression:            - Normal esophagus. Dilated.                        - Normal stomach.                        - Normal examined duodenum.                        - Two biopsies were obtained in the middle third of                         the esophagus. Recommendation:        - Discharge patient to home.                        - Resume previous diet.                        - Continue present medications.                        - Await pathology results. Procedure Code(s):     --- Professional ---                        [phone], Esophagogastroduodenoscopy, flexible,                         transoral; with transendoscopic balloon dilation of                         esophagus (less than 30 mm diameter)  43239, 59, Esophagogastroduodenoscopy, flexible,                         transoral; with biopsy, single or multiple Diagnosis Code(s):     --- Professional ---                        R13.10, Dysphagia, unspecified CPT copyright 2022 American Medical Association. All rights reserved. The codes documented in this report are preliminary and upon coder review may  be revised to meet current compliance requirements. Midge Minium MD, MD 06/29/2023 8:46:03 AM This report has been signed electronically. Number of Addenda:  0 Note Initiated On: 06/29/2023 8:31 AM Total Procedure Duration: 0 hours 4 minutes 34 seconds  Estimated Blood Loss:  Estimated blood loss: none.      [facility]

## 2023-06-29 NOTE — H&P (Signed)
Midge Minium, MD [facility] [address] [place], [place] [address] Phone:[phone] Fax : [phone]  Primary Care Physician:  Ethelda Chick, MD Primary Gastroenterologist:  Dr. Servando Snare  Pre-Procedure History & Physical: HPI:  Timothy Mccullough is a 52 y.o. male is here for an endoscopy.   Past Medical History:  Diagnosis Date   Atypical mole 09/23/2018   L UQA periumbilical/moderate, R above waistline lat abdomen/moderate   Atypical mole 11/18/2018   R upper mid back paraspinal/moderate, L inf med scapula back/moderate   Atypical mole 12/21/2018   L mid back paraspinal/excision   Atypical mole 08/16/2019   L mid back 5.5cm lat to spine/shave, L lat infrascapular near post axillary fold, shave   Basal cell carcinoma 03/29/2020   right alar crease   Dysplastic nevus 12/20/2020   right mid back paraspinal   Dysplastic nevus 08/08/2021   right lower quad abdomen, mild atypia   GERD (gastroesophageal reflux disease)    Hypertension    Melanoma in situ (HCC) 09/27/2020   right mid back lat near side - Excised 10/23/2020    History reviewed. No pertinent surgical history.  Prior to Admission medications   Medication Sig Start Date End Date Taking? Authorizing Provider  amLODipine (NORVASC) 5 MG tablet Take by mouth. Patient not taking: Reported on 06/26/2023 05/07/20   [provider]  aspirin 81 MG EC tablet Take by mouth. Patient not taking: Reported on 06/26/2023    [provider]  tadalafil (CIALIS) 20 MG tablet Take by mouth. Patient not taking: Reported on 06/26/2023 12/03/22   [provider]    Allergies as of 06/26/2023   (No Known Allergies)    Family History  Problem Relation Age of Onset   Diabetes Mother    Cancer Father     Social History   Socioeconomic History   Marital status: Married    Spouse name: Not on file   Number of children: Not on file   Years of education: Not on file   Highest education level: Not on file   Occupational History   Not on file  Tobacco Use   Smoking status: Never   Smokeless tobacco: Never  Substance and Sexual Activity   Alcohol use: No   Drug use: No   Sexual activity: Not on file  Other Topics Concern   Not on file  Social History Narrative   Not on file   Social Determinants of Health   Financial Resource Strain: Not on file  Food Insecurity: Not on file  Transportation Needs: Not on file  Physical Activity: Sufficiently Active (03/24/2019)   Received from [facility]   Exercise Vital Sign    Days of Exercise per Week: 5 days    Minutes of Exercise per Session: 100 min  Stress: Not on file  Social Connections: Not on file  Intimate Partner Violence: Not on file    Review of Systems: See HPI, otherwise negative ROS  Physical Exam: BP (!) 142/87   Pulse 83   Temp 98.2 F (36.8 C) (Temporal)   Resp 18   Ht 5\' 10"  (1.778 m)   Wt 106.6 kg   SpO2 96%   BMI 33.72 kg/m  General:   Alert,  pleasant and cooperative in NAD Head:  Normocephalic and atraumatic. Neck:  Supple; no masses or thyromegaly. Lungs:  Clear throughout to auscultation.    Heart:  Regular rate and rhythm. Abdomen:  Soft, nontender and nondistended. Normal bowel sounds,  without guarding, and without rebound.   Neurologic:  Alert and  oriented x4;  grossly normal neurologically.  Impression/Plan: Timothy Mccullough is here for an endoscopy to be performed for dysphagia  Risks, benefits, limitations, and alternatives regarding  endoscopy have been reviewed with the patient.  Questions have been answered.  All parties agreeable.   Midge Minium, MD  06/29/2023, 8:30 AM

## 2023-06-29 NOTE — Transfer of Care (Signed)
Immediate Anesthesia Transfer of Care Note  Patient: Timothy Mccullough  Procedure(s) Performed: ESOPHAGOGASTRODUODENOSCOPY (EGD) WITH PROPOFOL (Throat) ESOPHAGEAL DILATION (Throat)  Patient Location: PACU  Anesthesia Type: General  Level of Consciousness: awake, alert  and patient cooperative  Airway and Oxygen Therapy: Patient Spontanous Breathing and Patient connected to supplemental oxygen  Post-op Assessment: Post-op Vital signs reviewed, Patient's Cardiovascular Status Stable, Respiratory Function Stable, Patent Airway and No signs of Nausea or vomiting  Post-op Vital Signs: Reviewed and stable  Complications: No notable events documented.

## 2023-06-30 ENCOUNTER — Encounter: Payer: Self-pay | Admitting: Gastroenterology

## 2023-07-01 LAB — SURGICAL PATHOLOGY

## 2023-07-02 ENCOUNTER — Encounter: Payer: Self-pay | Admitting: Gastroenterology

## 2023-11-16 ENCOUNTER — Ambulatory Visit: Payer: BC Managed Care – PPO | Admitting: Dermatology

## 2023-11-23 ENCOUNTER — Encounter: Payer: Self-pay | Admitting: Dermatology

## 2023-11-23 ENCOUNTER — Ambulatory Visit: Admitting: Dermatology

## 2023-11-23 DIAGNOSIS — Z1283 Encounter for screening for malignant neoplasm of skin: Secondary | ICD-10-CM

## 2023-11-23 DIAGNOSIS — Z85828 Personal history of other malignant neoplasm of skin: Secondary | ICD-10-CM

## 2023-11-23 DIAGNOSIS — L918 Other hypertrophic disorders of the skin: Secondary | ICD-10-CM | POA: Diagnosis not present

## 2023-11-23 DIAGNOSIS — L570 Actinic keratosis: Secondary | ICD-10-CM

## 2023-11-23 DIAGNOSIS — L578 Other skin changes due to chronic exposure to nonionizing radiation: Secondary | ICD-10-CM | POA: Diagnosis not present

## 2023-11-23 DIAGNOSIS — W908XXA Exposure to other nonionizing radiation, initial encounter: Secondary | ICD-10-CM | POA: Diagnosis not present

## 2023-11-23 DIAGNOSIS — D1801 Hemangioma of skin and subcutaneous tissue: Secondary | ICD-10-CM | POA: Diagnosis not present

## 2023-11-23 DIAGNOSIS — L821 Other seborrheic keratosis: Secondary | ICD-10-CM

## 2023-11-23 DIAGNOSIS — D492 Neoplasm of unspecified behavior of bone, soft tissue, and skin: Secondary | ICD-10-CM

## 2023-11-23 DIAGNOSIS — D235 Other benign neoplasm of skin of trunk: Secondary | ICD-10-CM

## 2023-11-23 DIAGNOSIS — L814 Other melanin hyperpigmentation: Secondary | ICD-10-CM

## 2023-11-23 DIAGNOSIS — D229 Melanocytic nevi, unspecified: Secondary | ICD-10-CM

## 2023-11-23 NOTE — Patient Instructions (Addendum)
Wound Care Instructions  Cleanse wound gently with soap and water once a day then pat dry with clean gauze. Apply a thin coat of Petrolatum (petroleum jelly, "Vaseline") over the wound (unless you have an allergy to this). We recommend that you use a new, sterile tube of Vaseline. Do not pick or remove scabs. Do not remove the yellow or white "healing tissue" from the base of the wound.  Cover the wound with fresh, clean, nonstick gauze and secure with paper tape. You may use Band-Aids in place of gauze and tape if the wound is small enough, but would recommend trimming much of the tape off as there is often too much. Sometimes Band-Aids can irritate the skin.  You should call the office for your biopsy report after 1 week if you have not already been contacted.  If you experience any problems, such as abnormal amounts of bleeding, swelling, significant bruising, significant pain, or evidence of infection, please call the office immediately.  FOR ADULT SURGERY PATIENTS: If you need something for pain relief you may take 1 extra strength Tylenol (acetaminophen) AND 2 Ibuprofen (200mg  each) together every 4 hours as needed for pain. (do not take these if you are allergic to them or if you have a reason you should not take them.) Typically, you may only need pain medication for 1 to 3 days.    Cryotherapy Aftercare  Wash gently with soap and water everyday.   Apply Vaseline and Band-Aid daily until healed.    Recommend daily broad spectrum sunscreen SPF 30+ to sun-exposed areas, reapply every 2 hours as needed. Call for new or changing lesions.  Staying in the shade or wearing long sleeves, sun glasses (UVA+UVB protection) and wide brim hats (4-inch brim around the entire circumference of the hat) are also recommended for sun protection.    Melanoma ABCDEs  Melanoma is the most dangerous type of skin cancer, and is the leading cause of death from skin disease.  You are more likely to develop  melanoma if you: Have light-colored skin, light-colored eyes, or red or blond hair Spend a lot of time in the sun Tan regularly, either outdoors or in a tanning bed Have had blistering sunburns, especially during childhood Have a close family member who has had a melanoma Have atypical moles or large birthmarks  Early detection of melanoma is key since treatment is typically straightforward and cure rates are extremely high if we catch it early.   The first sign of melanoma is often a change in a mole or a new dark spot.  The ABCDE system is a way of remembering the signs of melanoma.  A for asymmetry:  The two halves do not match. B for border:  The edges of the growth are irregular. C for color:  A mixture of colors are present instead of an even brown color. D for diameter:  Melanomas are usually (but not always) greater than 6mm - the size of a pencil eraser. E for evolution:  The spot keeps changing in size, shape, and color.  Please check your skin once per month between visits. You can use a small mirror in front and a large mirror behind you to keep an eye on the back side or your body.   If you see any new or changing lesions before your next follow-up, please call to schedule a visit.  Please continue daily skin protection including broad spectrum sunscreen SPF 30+ to sun-exposed areas, reapplying every 2 hours as  needed when you're outdoors.   Staying in the shade or wearing long sleeves, sun glasses (UVA+UVB protection) and wide brim hats (4-inch brim around the entire circumference of the hat) are also recommended for sun protection.    Due to recent changes in healthcare laws, you may see results of your pathology and/or laboratory studies on MyChart before the doctors have had a chance to review them. We understand that in some cases there may be results that are confusing or concerning to you. Please understand that not all results are received at the same time and often the  doctors may need to interpret multiple results in order to provide you with the best plan of care or course of treatment. Therefore, we ask that you please give [place] 2 business days to thoroughly review all your results before contacting the office for clarification. Should we see a critical lab result, you will be contacted sooner.   If You Need Anything After Your Visit  If you have any questions or concerns for your doctor, please call our main line at [phone] and press option 4 to reach your doctor's medical assistant. If no one answers, please leave a voicemail as directed and we will return your call as soon as possible. Messages left after 4 pm will be answered the following business day.   You may also send [place] a message via MyChart. We typically respond to MyChart messages within 1-2 business days.  For prescription refills, please ask your pharmacy to contact our office. Our fax number is [phone].  If you have an urgent issue when the clinic is closed that cannot wait until the next business day, you can page your doctor at the number below.    Please note that while we do our best to be available for urgent issues outside of office hours, we are not available 24/7.   If you have an urgent issue and are unable to reach [place], you may choose to seek medical care at your doctor's office, retail clinic, urgent care center, or emergency room.  If you have a medical emergency, please immediately call 911 or go to the emergency department.  Pager Numbers  - Dr. Gwen Pounds: [phone]  - Dr. Roseanne Reno: [phone]  - Dr. Katrinka Blazing: [phone]   In the event of inclement weather, please call our main line at [phone] for an update on the status of any delays or closures.  Dermatology Medication Tips: Please keep the boxes that topical medications come in in order to help keep track of the instructions about where and how to use these. Pharmacies typically print the medication  instructions only on the boxes and not directly on the medication tubes.   If your medication is too expensive, please contact our office at [phone] option 4 or send [place] a message through MyChart.   We are unable to tell what your co-pay for medications will be in advance as this is different depending on your insurance coverage. However, we may be able to find a substitute medication at lower cost or fill out paperwork to get insurance to cover a needed medication.   If a prior authorization is required to get your medication covered by your insurance company, please allow [place] 1-2 business days to complete this process.  Drug prices often vary depending on where the prescription is filled and some pharmacies may offer cheaper prices.  The website www.goodrx.com contains coupons for medications through different pharmacies. The prices here do not account for what  the cost may be with help from insurance (it may be cheaper with your insurance), but the website can give you the price if you did not use any insurance.  - You can print the associated coupon and take it with your prescription to the pharmacy.  - You may also stop by our office during regular business hours and pick up a GoodRx coupon card.  - If you need your prescription sent electronically to a different pharmacy, notify our office through [facility] or by phone at [phone] option 4.     Si Usted Necesita Algo Despus de Su Visita  Tambin puede enviarnos un mensaje a travs de Clinical cytogeneticist. Por lo general respondemos a los mensajes de MyChart en el transcurso de 1 a 2 das hbiles.  Para renovar recetas, por favor pida a su farmacia que se ponga en contacto con nuestra oficina. Annie Sable de fax es [place] [phone].  Si tiene un asunto urgente cuando la clnica est cerrada y que no puede esperar hasta el siguiente da hbil, puede llamar/localizar a su doctor(a) al nmero que aparece a continuacin.   Por  favor, tenga en cuenta que aunque hacemos todo lo posible para estar disponibles para asuntos urgentes fuera del horario de [place], no estamos disponibles las 24 horas del da, los 7 [address]  [address] de la [place].   Si tiene un problema urgente y no puede comunicarse con nosotros, puede optar por buscar atencin mdica  en el consultorio de su doctor(a), en una clnica privada, en un centro de atencin urgente o en una sala de emergencias.  Si tiene Engineer, drilling, por favor llame inmediatamente al 911 o vaya a la sala de emergencias.  Nmeros de bper  - Dr. Gwen Pounds: [phone]  - Dra. Roseanne Reno: [phone]  - Dr. Katrinka Blazing: [phone]   En caso de inclemencias del tiempo, por favor llame a Lacy Duverney principal al [phone] para una actualizacin sobre el [place] de cualquier retraso o cierre.  Consejos para la medicacin en dermatologa: Por favor, guarde las cajas en las que vienen los medicamentos de uso tpico para ayudarle a seguir las instrucciones sobre dnde y cmo usarlos. Las farmacias generalmente imprimen las instrucciones del medicamento slo en las cajas y no directamente en los tubos del [place].   Si su medicamento es muy caro, por favor, pngase en contacto con Rolm Gala llamando al [phone] y presione la opcin 4 o envenos un mensaje a travs de Clinical cytogeneticist.   No podemos decirle cul ser su copago por los medicamentos por adelantado ya que esto es diferente dependiendo de la cobertura de su seguro. Sin embargo, es posible que podamos encontrar un medicamento sustituto a Audiological scientist un formulario para que el seguro cubra el medicamento que se considera necesario.   Si se requiere una autorizacin previa para que su compaa de seguros [place] su medicamento, por favor permtanos de 1 a 2 das hbiles para completar [address]  Los precios de los medicamentos varan con frecuencia dependiendo del Environmental consultant de dnde se surte la receta y alguna farmacias  pueden ofrecer precios ms baratos.  El sitio web www.goodrx.com tiene cupones para medicamentos de Health and safety inspector. Los precios aqu no tienen en cuenta lo que podra costar con la ayuda del seguro (puede ser ms barato con su seguro), pero el sitio web puede darle el precio si no utiliz Tourist information centre manager.  - Puede imprimir el cupn correspondiente y llevarlo con su receta a la farmacia.  - Tambin puede  pasar por nuestra oficina durante el horario de atencin regular y Education officer, museum una tarjeta de cupones de GoodRx.  - Si necesita que su receta se enve electrnicamente a una farmacia diferente, informe a nuestra oficina a travs de MyChart de  o por telfono llamando al [phone] y presione la opcin 4.

## 2023-11-23 NOTE — Progress Notes (Signed)
 Follow-Up Visit   Subjective  Timothy Mccullough is a 53 y.o. male who presents for the following: Skin Cancer Screening and Full Body Skin Exam. Patient with history of dysplastic nevi, BCC,  and MMIS.   The patient presents for Total-Body Skin Exam (TBSE) for skin cancer screening and mole check. The patient has spots, moles and lesions to be evaluated, some may be new or changing and the patient may have concern these could be cancer.   The following portions of the chart were reviewed this encounter and updated as appropriate: medications, allergies, medical history  Review of Systems:  No other skin or systemic complaints except as noted in HPI or Assessment and Plan.  Objective  Well appearing patient in no apparent distress; mood and affect are within normal limits.  A full examination was performed including scalp, head, eyes, ears, nose, lips, neck, chest, axillae, abdomen, back, buttocks, bilateral upper extremities, bilateral lower extremities, hands, feet, fingers, toes, fingernails, and toenails. All findings within normal limits unless otherwise noted below.   Relevant physical exam findings are noted in the Assessment and Plan.  R inferior helix x1, L inferior helix x1, L dorsal hand x6, L forearm x1 (9) Pink scaly macules Left nipple 4mm skin colored papule  Left anterior neck base 6mm red indurated vascular papule  R upper paraspinal back 7mm pink to brown macule   Assessment & Plan   SKIN CANCER SCREENING PERFORMED TODAY.  ACTINIC DAMAGE - Chronic condition, secondary to cumulative UV/sun exposure - diffuse scaly erythematous macules with underlying dyspigmentation - Recommend daily broad spectrum sunscreen SPF 30+ to sun-exposed areas, reapply every 2 hours as needed.  - Staying in the shade or wearing long sleeves, sun glasses (UVA+UVB protection) and wide brim hats (4-inch brim around the entire circumference of the hat) are also recommended for sun  protection.  - Call for new or changing lesions.  LENTIGINES, SEBORRHEIC KERATOSES, HEMANGIOMAS - Benign normal skin lesions - Benign-appearing - Call for any changes  MELANOCYTIC NEVI - Tan-brown and/or pink-flesh-colored symmetric macules and papules - Benign appearing on exam today - Observation - Call clinic for new or changing moles - Recommend daily use of broad spectrum spf 30+ sunscreen to sun-exposed areas.   HISTORY OF DYSPLASTIC NEVUS Multiple see history No evidence of recurrence today Recommend regular full body skin exams Recommend daily broad spectrum sunscreen SPF 30+ to sun-exposed areas, reapply every 2 hours as needed.  Call if any new or changing lesions are noted between office visits   HISTORY OF BASAL CELL CARCINOMA OF THE SKIN Right alar crease- 03/29/2020 - No evidence of recurrence today - Recommend regular full body skin exams - Recommend daily broad spectrum sunscreen SPF 30+ to sun-exposed areas, reapply every 2 hours as needed.  - Call if any new or changing lesions are noted between office visits  HISTORY OF MELANOMA IN SITU Right mid back lat near side- Excised 10/23/2020 - No evidence of recurrence today - Recommend regular full body skin exams - Recommend daily broad spectrum sunscreen SPF 30+ to sun-exposed areas, reapply every 2 hours as needed.  - Call if any new or changing lesions are noted between office visits   AK (ACTINIC KERATOSIS) (9) R inferior helix x1, L inferior helix x1, L dorsal hand x6, L forearm x1 (9) Actinic keratoses are precancerous spots that appear secondary to cumulative UV radiation exposure/sun exposure over time. They are chronic with expected duration over 1 year. A portion of actinic  keratoses will progress to squamous cell carcinoma of the skin. It is not possible to reliably predict which spots will progress to skin cancer and so treatment is recommended to prevent development of skin cancer.  Recommend daily  broad spectrum sunscreen SPF 30+ to sun-exposed areas, reapply every 2 hours as needed.  Recommend staying in the shade or wearing long sleeves, sun glasses (UVA+UVB protection) and wide brim hats (4-inch brim around the entire circumference of the hat). Call for new or changing lesions. Destruction of lesion - R inferior helix x1, L inferior helix x1, L dorsal hand x6, L forearm x1 (9) Complexity: simple   Destruction method: cryotherapy   Informed consent: discussed and consent obtained   Timeout:  patient name, date of birth, surgical site, and procedure verified Lesion destroyed using liquid nitrogen: Yes   Region frozen until ice ball extended beyond lesion: Yes   Cryo cycles: 1 or 2. Outcome: patient tolerated procedure well with no complications   Post-procedure details: wound care instructions given   NEOPLASM OF SKIN (3) Left nipple Skin / nail biopsy Type of biopsy: tangential   Informed consent: discussed and consent obtained   Timeout: patient name, date of birth, surgical site, and procedure verified   Procedure prep:  Patient was prepped and draped in usual sterile fashion Prep type:  Isopropyl alcohol Anesthesia: the lesion was anesthetized in a standard fashion   Anesthetic:  1% lidocaine  w/ epinephrine  1-100,000 buffered w/ 8.4% NaHCO3 Instrument used: DermaBlade   Hemostasis achieved with: pressure and aluminum chloride   Outcome: patient tolerated procedure well   Post-procedure details: sterile dressing applied and wound care instructions given   Dressing type: bandage and petrolatum   Specimen 1 - Surgical pathology Differential Diagnosis: acrochordon vs other, r/o malignancy  Check Margins: No 4mm skin colored papule Left anterior neck base Skin / nail biopsy Type of biopsy: tangential   Informed consent: discussed and consent obtained   Timeout: patient name, date of birth, surgical site, and procedure verified   Procedure prep:  Patient was prepped and  draped in usual sterile fashion Prep type:  Isopropyl alcohol Anesthesia: the lesion was anesthetized in a standard fashion   Anesthetic:  1% lidocaine  w/ epinephrine  1-100,000 buffered w/ 8.4% NaHCO3 Instrument used: DermaBlade   Hemostasis achieved with: pressure and aluminum chloride   Outcome: patient tolerated procedure well   Post-procedure details: sterile dressing applied and wound care instructions given   Dressing type: bandage and petrolatum   Specimen 2 - Surgical pathology Differential Diagnosis: nevus vs angioma vs scar vs other  Check Margins: No 6mm red indurated vascular papule R upper paraspinal back Skin / nail biopsy Type of biopsy: tangential   Informed consent: discussed and consent obtained   Timeout: patient name, date of birth, surgical site, and procedure verified   Procedure prep:  Patient was prepped and draped in usual sterile fashion Prep type:  Isopropyl alcohol Anesthesia: the lesion was anesthetized in a standard fashion   Anesthetic:  1% lidocaine  w/ epinephrine  1-100,000 buffered w/ 8.4% NaHCO3 Instrument used: DermaBlade   Hemostasis achieved with: pressure and aluminum chloride   Outcome: patient tolerated procedure well   Post-procedure details: sterile dressing applied and wound care instructions given   Dressing type: bandage and petrolatum   Specimen 3 - Surgical pathology Differential Diagnosis: dysplastic nevus vs melanoma  Check Margins: No 7mm pink to brown macule MULTIPLE BENIGN NEVI   LENTIGINES   ACTINIC ELASTOSIS   SEBORRHEIC KERATOSES  CHERRY ANGIOMA    Return in about 6 months (around 05/25/2024) for Hx BCC, Hx Dysplastic Nevi, MMIS, w/ Dr. Felipe Horton.  I, Jacquelynn Vera, CMA, am acting as scribe for Harris Liming, MD .   Documentation: I have reviewed the above documentation for accuracy and completeness, and I agree with the above.  Harris Liming, MD

## 2023-11-27 LAB — SURGICAL PATHOLOGY

## 2023-11-28 ENCOUNTER — Encounter: Payer: Self-pay | Admitting: Dermatology

## 2024-03-02 ENCOUNTER — Ambulatory Visit (INDEPENDENT_AMBULATORY_CARE_PROVIDER_SITE_OTHER): Payer: Self-pay

## 2024-03-02 DIAGNOSIS — Z09 Encounter for follow-up examination after completed treatment for conditions other than malignant neoplasm: Secondary | ICD-10-CM | POA: Diagnosis present

## 2024-03-02 DIAGNOSIS — Z860101 Personal history of adenomatous and serrated colon polyps: Secondary | ICD-10-CM | POA: Diagnosis not present

## 2024-03-02 DIAGNOSIS — K5730 Diverticulosis of large intestine without perforation or abscess without bleeding: Secondary | ICD-10-CM | POA: Diagnosis not present

## 2024-03-02 DIAGNOSIS — K635 Polyp of colon: Secondary | ICD-10-CM | POA: Diagnosis not present

## 2024-05-26 ENCOUNTER — Encounter: Payer: Self-pay | Admitting: Dermatology

## 2024-05-26 ENCOUNTER — Ambulatory Visit: Admitting: Dermatology

## 2024-05-26 DIAGNOSIS — Z1283 Encounter for screening for malignant neoplasm of skin: Secondary | ICD-10-CM | POA: Diagnosis not present

## 2024-05-26 DIAGNOSIS — D229 Melanocytic nevi, unspecified: Secondary | ICD-10-CM

## 2024-05-26 DIAGNOSIS — W908XXA Exposure to other nonionizing radiation, initial encounter: Secondary | ICD-10-CM

## 2024-05-26 DIAGNOSIS — Z86006 Personal history of melanoma in-situ: Secondary | ICD-10-CM

## 2024-05-26 DIAGNOSIS — L570 Actinic keratosis: Secondary | ICD-10-CM

## 2024-05-26 DIAGNOSIS — D239 Other benign neoplasm of skin, unspecified: Secondary | ICD-10-CM

## 2024-05-26 DIAGNOSIS — Z86018 Personal history of other benign neoplasm: Secondary | ICD-10-CM

## 2024-05-26 DIAGNOSIS — L821 Other seborrheic keratosis: Secondary | ICD-10-CM | POA: Diagnosis not present

## 2024-05-26 DIAGNOSIS — L814 Other melanin hyperpigmentation: Secondary | ICD-10-CM | POA: Diagnosis not present

## 2024-05-26 DIAGNOSIS — L578 Other skin changes due to chronic exposure to nonionizing radiation: Secondary | ICD-10-CM

## 2024-05-26 DIAGNOSIS — D1801 Hemangioma of skin and subcutaneous tissue: Secondary | ICD-10-CM

## 2024-05-26 DIAGNOSIS — Z85828 Personal history of other malignant neoplasm of skin: Secondary | ICD-10-CM

## 2024-05-26 DIAGNOSIS — Z7189 Other specified counseling: Secondary | ICD-10-CM

## 2024-05-26 MED ORDER — FLUOROURACIL 5 % EX CREA: TOPICAL_CREAM | Freq: Two times a day (BID) | CUTANEOUS | 2 refills | Status: AC

## 2024-05-26 NOTE — Progress Notes (Signed)
 Follow-Up Visit   Subjective  Timothy Mccullough is a 53 y.o. male who presents for the following: Skin Cancer Screening and Full Body Skin Exam hx of Melanoma IS, Dysplastic Nevi, Aks, check spot R post ankle/lower leg, boots irritate it  The patient presents for Total-Body Skin Exam (TBSE) for skin cancer screening and mole check. The patient has spots, moles and lesions to be evaluated, some may be new or changing and the patient may have concern these could be cancer.    The following portions of the chart were reviewed this encounter and updated as appropriate: medications, allergies, medical history  Review of Systems:  No other skin or systemic complaints except as noted in HPI or Assessment and Plan.  Objective  Well appearing patient in no apparent distress; mood and affect are within normal limits.  A full examination was performed including scalp, head, eyes, ears, nose, lips, neck, chest, axillae, abdomen, back, buttocks, bilateral upper extremities, bilateral lower extremities, hands, feet, fingers, toes, fingernails, and toenails. All findings within normal limits unless otherwise noted below.   Relevant physical exam findings are noted in the Assessment and Plan.  L antehelix x 2, L sup helix x 1, L zygoma x 1 (4) Pink scaly macules  Assessment & Plan   SKIN CANCER SCREENING PERFORMED TODAY.  ACTINIC DAMAGE - Chronic condition, secondary to cumulative UV/sun exposure - diffuse scaly erythematous macules with underlying dyspigmentation - Recommend daily broad spectrum sunscreen SPF 30+ to sun-exposed areas, reapply every 2 hours as needed.  - Staying in the shade or wearing long sleeves, sun glasses (UVA+UVB protection) and wide brim hats (4-inch brim around the entire circumference of the hat) are also recommended for sun protection.  - Call for new or changing lesions.  LENTIGINES, SEBORRHEIC KERATOSES, HEMANGIOMAS - Benign normal skin lesions - Benign-appearing -  Call for any changes  MELANOCYTIC NEVI - Tan-brown and/or pink-flesh-colored symmetric macules and papules - Benign appearing on exam today - Observation - Call clinic for new or changing moles - Recommend daily use of broad spectrum spf 30+ sunscreen to sun-exposed areas.   HISTORY OF MELANOMA IN SITU - No evidence of recurrence today - Recommend regular full body skin exams - Recommend daily broad spectrum sunscreen SPF 30+ to sun-exposed areas, reapply every 2 hours as needed.  - Call if any new or changing lesions are noted between office visits  - R mid back lat near side, excised 10/23/20  HISTORY OF BASAL CELL CARCINOMA OF THE SKIN - No evidence of recurrence today - Recommend regular full body skin exams - Recommend daily broad spectrum sunscreen SPF 30+ to sun-exposed areas, reapply every 2 hours as needed.  - Call if any new or changing lesions are noted between office visits  - R alar crease  HISTORY OF DYSPLASTIC NEVUS No evidence of recurrence today Recommend regular full body skin exams Recommend daily broad spectrum sunscreen SPF 30+ to sun-exposed areas, reapply every 2 hours as needed.  Call if any new or changing lesions are noted between office visits  -LUQA periumbilical, R above waistline lat abdomen, R upper mid back paraspinal, L inf med scapula back, L mid back paraspinal, L mid back 5.5cm lat to spine, L infrascapular near post axillary fold AK (ACTINIC KERATOSIS) (4) L antehelix x 2, L sup helix x 1, L zygoma x 1 (4) Actinic keratoses are precancerous spots that appear secondary to cumulative UV radiation exposure/sun exposure over time. They are chronic with expected duration  over 1 year. A portion of actinic keratoses will progress to squamous cell carcinoma of the skin. It is not possible to reliably predict which spots will progress to skin cancer and so treatment is recommended to prevent development of skin cancer.  Recommend daily broad spectrum  sunscreen SPF 30+ to sun-exposed areas, reapply every 2 hours as needed.  Recommend staying in the shade or wearing long sleeves, sun glasses (UVA+UVB protection) and wide brim hats (4-inch brim around the entire circumference of the hat). Call for new or changing lesions. Destruction of lesion - L antehelix x 2, L sup helix x 1, L zygoma x 1 (4)  Destruction method: cryotherapy   Informed consent: discussed and consent obtained   Lesion destroyed using liquid nitrogen: Yes   Region frozen until ice ball extended beyond lesion: Yes   Outcome: patient tolerated procedure well with no complications   Post-procedure details: wound care instructions given   Additional details:  Prior to procedure, discussed risks of blister formation, small wound, skin dyspigmentation, or rare scar following cryotherapy. Recommend Vaseline ointment to treated areas while healing.   Related Medications fluorouracil (EFUDEX) 5 % cream Apply topically 2 (two) times daily. BID to backs of hands, apply until the redness and irritation develop (usually occurs by day 7), then stop and allow it to heal. Protect the area from sunlight while it is healing with a hat or SPF30+ sunscreen. MULTIPLE BENIGN NEVI   LENTIGINES   ACTINIC ELASTOSIS   SEBORRHEIC KERATOSES   CHERRY ANGIOMA   DERMATOFIBROMA    ACTINIC DAMAGE WITH PRECANCEROUS ACTINIC KERATOSES Counseling for Topical Chemotherapy Management: Patient exhibits: - Severe, confluent actinic changes with pre-cancerous actinic keratoses that is secondary to cumulative UV radiation exposure over time - Condition that is severe; chronic, not at goal. - diffuse scaly erythematous macules and papules with underlying dyspigmentation - Discussed Prescription Field Treatment topical Chemotherapy for Severe, Chronic Confluent Actinic Changes with Pre-Cancerous Actinic Keratoses Field treatment involves treatment of an entire area of skin that has confluent  Actinic Changes (Sun/ Ultraviolet light damage) and PreCancerous Actinic Keratoses by method of PhotoDynamic Therapy (PDT) and/or prescription Topical Chemotherapy agents such as 5-fluorouracil, 5-fluorouracil/calcipotriene, and/or imiquimod.  The purpose is to decrease the number of clinically evident and subclinical PreCancerous lesions to prevent progression to development of skin cancer by chemically destroying early precancer changes that may or may not be visible.  It has been shown to reduce the risk of developing skin cancer in the treated area. As a result of treatment, redness, scaling, crusting, and open sores may occur during treatment course. One or more than one of these methods may be used and may have to be used several times to control, suppress and eliminate the PreCancerous changes. Discussed treatment course, expected reaction, and possible side effects. - Recommend daily broad spectrum sunscreen SPF 30+ to sun-exposed areas, reapply every 2 hours as needed.  - Staying in the shade or wearing long sleeves, sun glasses (UVA+UVB protection) and wide brim hats (4-inch brim around the entire circumference of the hat) are also recommended. - Call for new or changing lesions.   Reviewed course of treatment and expected reaction.  Patient advised to expect inflammation and crusting and advised that erosions are possible.   Plan: 5FU calcip BID up to a week dorsal hands    Return in about 4 months (around 09/23/2024) for TBSE, Hx of Melanoma IS, Hx of Dysplastic nevi, Hx of AKs.  I, Sonya Hupman, RMA, am  acting as scribe for Boneta Sharps, MD .   Documentation: I have reviewed the above documentation for accuracy and completeness, and I agree with the above.  Boneta Sharps, MD

## 2024-05-26 NOTE — Patient Instructions (Addendum)
 Cryotherapy Aftercare  Wash gently with soap and water  everyday.   Apply Vaseline and Band-Aid daily until healed.   - Start 5-fluorouracil/calcipotriene cream twice a day until redness and irritation develop to affected areas including dorsal hands (backs of hands, one hand at a time). Prescription sent to [facility] Patient advised they will receive an email to purchase the medication online and have it sent to their home. Patient provided with handout reviewing treatment course and side effects and advised to call or message us  on MyChart with any concerns.  [facility] [address] [place], [place] [address]  Phone: [phone] TOLL-FREE: [phone]     Due to recent changes in healthcare laws, you may see results of your pathology and/or laboratory studies on MyChart before the doctors have had a chance to review them. We understand that in some cases there may be results that are confusing or concerning to you. Please understand that not all results are received at the same time and often the doctors may need to interpret multiple results in order to provide you with the best plan of care or course of treatment. Therefore, we ask that you please give us  2 business days to thoroughly review all your results before contacting the office for clarification. Should we see a critical lab result, you will be contacted sooner.   If You Need Anything After Your Visit  If you have any questions or concerns for your doctor, please call our main line at [phone] and press option 4 to reach your doctor's medical assistant. If no one answers, please leave a voicemail as directed and we will return your call as soon as possible. Messages left after 4 pm will be answered the following business day.   You may also send us  a message via MyChart. We typically respond to MyChart messages within 1-2 business days.  For prescription refills, please ask your pharmacy to contact our  office. Our fax number is [phone].  If you have an urgent issue when the clinic is closed that cannot wait until the next business day, you can page your doctor at the number below.    Please note that while we do our best to be available for urgent issues outside of office hours, we are not available 24/7.   If you have an urgent issue and are unable to reach us , you may choose to seek medical care at your doctor's office, retail clinic, urgent care center, or emergency room.  If you have a medical emergency, please immediately call 911 or go to the emergency department.  Pager Numbers  - Dr. Hester: [phone]  - Dr. Jackquline: [phone]  - Dr. Claudene: [phone]   - Dr. Raymund: [phone]  In the event of inclement weather, please call our main line at [phone] for an update on the status of any delays or closures.  Dermatology Medication Tips: Please keep the boxes that topical medications come in in order to help keep track of the instructions about where and how to use these. Pharmacies typically print the medication instructions only on the boxes and not directly on the medication tubes.   If your medication is too expensive, please contact our office at [phone] option 4 or send us  a message through MyChart.   We are unable to tell what your co-pay for medications will be in advance as this is different depending on your insurance coverage. However, we may be able to find a substitute medication at lower cost or fill  out paperwork to get insurance to cover a needed medication.   If a prior authorization is required to get your medication covered by your insurance company, please allow us  1-2 business days to complete this process.  Drug prices often vary depending on where the prescription is filled and some pharmacies may offer cheaper prices.  The website www.goodrx.com contains coupons for medications through different pharmacies. The prices here do not  account for what the cost may be with help from insurance (it may be cheaper with your insurance), but the website can give you the price if you did not use any insurance.  - You can print the associated coupon and take it with your prescription to the pharmacy.  - You may also stop by our office during regular business hours and pick up a GoodRx coupon card.  - If you need your prescription sent electronically to a different pharmacy, notify our office through [facility] or by phone at [phone] option 4.     Si Usted Necesita Algo Despus de Su Visita  Tambin puede enviarnos un mensaje a travs de Clinical Cytogeneticist. Por lo general respondemos a los mensajes de MyChart en el transcurso de 1 a 2 das hbiles.  Para renovar recetas, por favor pida a su farmacia que se ponga en contacto con nuestra oficina. Randi lakes de fax es [place] [phone].  Si tiene un asunto urgente cuando la clnica est cerrada y que no puede esperar hasta el siguiente da hbil, puede llamar/localizar a su doctor(a) al nmero que aparece a continuacin.   Por favor, tenga en cuenta que aunque hacemos todo lo posible para estar disponibles para asuntos urgentes fuera del horario de [place], no estamos disponibles las 24 horas del da, los 7 [address]  [address] de la [place].   Si tiene un problema urgente y no puede comunicarse con nosotros, puede optar por buscar atencin mdica  en el consultorio de su doctor(a), en una clnica privada, en un centro de atencin urgente o en una sala de emergencias.  Si tiene engineer, drilling, por favor llame inmediatamente al 911 o vaya a la sala de emergencias.  Nmeros de bper  - Dr. Hester: [phone]  - Dra. Jackquline: [phone]  - Dr. Claudene: [phone]  - Dra. Kitts: [phone]  En caso de inclemencias del [place], por favor llame a nuestra lnea principal al [phone] para una actualizacin sobre el estado de cualquier retraso o cierre.  Consejos para la  medicacin en dermatologa: Por favor, guarde las cajas en las que vienen los medicamentos de uso tpico para ayudarle a seguir las instrucciones sobre dnde y cmo usarlos. Las farmacias generalmente imprimen las instrucciones del medicamento slo en las cajas y no directamente en los tubos del [place].   Si su medicamento es muy caro, por favor, pngase en contacto con landry rieger llamando al [phone] y presione la opcin 4 o envenos un mensaje a travs de Clinical Cytogeneticist.   No podemos decirle cul ser su copago por los medicamentos por adelantado ya que esto es diferente dependiendo de la cobertura de su seguro. Sin embargo, es posible que podamos encontrar un medicamento sustituto a audiological scientist un formulario para que el seguro cubra el medicamento que se considera necesario.   Si se requiere una autorizacin previa para que su compaa de seguros cubra su medicamento, por favor permtanos de 1 a 2 das hbiles para completar este proceso.  Los precios de los medicamentos varan con frecuencia dependiendo del environmental consultant de dnde  se surte la receta y alguna farmacias pueden ofrecer precios ms baratos.  El sitio web www.goodrx.com tiene cupones para medicamentos de health and safety inspector. Los precios aqu no tienen en cuenta lo que podra costar con la ayuda del seguro (puede ser ms barato con su seguro), pero el sitio web puede darle el precio si no utiliz tourist information centre manager.  - Puede imprimir el cupn correspondiente y llevarlo con su receta a la farmacia.  - Tambin puede pasar por nuestra oficina durante el horario de atencin regular y education officer, museum una tarjeta de cupones de GoodRx.  - Si necesita que su receta se enve electrnicamente a una farmacia diferente, informe a nuestra oficina a travs de MyChart de [place] o por telfono llamando al [phone] y presione la opcin 4.

## 2024-09-27 ENCOUNTER — Ambulatory Visit: Admitting: Dermatology
